# Patient Record
Sex: Female | Born: 1955 | Race: Black or African American | Hispanic: No | Marital: Married | Smoking: Current every day smoker
Health system: Southern US, Community
[De-identification: ages and names within clinical notes are randomized; demographics above are authoritative.]

## PROBLEM LIST (undated history)

## (undated) DIAGNOSIS — I10 Essential (primary) hypertension: Secondary | ICD-10-CM

## (undated) DIAGNOSIS — E785 Hyperlipidemia, unspecified: Secondary | ICD-10-CM

## (undated) DIAGNOSIS — J45909 Unspecified asthma, uncomplicated: Secondary | ICD-10-CM

## (undated) HISTORY — PX: ABDOMINAL HYSTERECTOMY: SHX81

## (undated) HISTORY — PX: UTERINE FIBROID SURGERY: SHX826

---

## 2004-01-13 ENCOUNTER — Inpatient Hospital Stay (HOSPITAL_COMMUNITY): Admission: RE | Admit: 2004-01-13 | Discharge: 2004-01-15 | Payer: Self-pay | Admitting: Obstetrics and Gynecology

## 2011-01-09 ENCOUNTER — Encounter: Payer: Self-pay | Admitting: Sports Medicine

## 2012-12-19 ENCOUNTER — Encounter (HOSPITAL_COMMUNITY): Payer: Self-pay | Admitting: Emergency Medicine

## 2012-12-19 ENCOUNTER — Emergency Department (HOSPITAL_COMMUNITY)
Admission: EM | Admit: 2012-12-19 | Discharge: 2012-12-19 | Disposition: A | Discharge status: Home / Self Care | Payer: Managed Care, Other (non HMO) | Attending: Emergency Medicine | Admitting: Emergency Medicine

## 2012-12-19 DIAGNOSIS — M545 Low back pain, unspecified: Secondary | ICD-10-CM | POA: Insufficient documentation

## 2012-12-19 HISTORY — DX: Unspecified asthma, uncomplicated: J45.909

## 2012-12-19 HISTORY — DX: Essential (primary) hypertension: I10

## 2012-12-19 HISTORY — DX: Hyperlipidemia, unspecified: E78.5

## 2012-12-19 MED ORDER — HYDROCODONE-ACETAMINOPHEN 5-325 MG PO TABS: 1.0000 | ORAL_TABLET | Freq: Four times a day (QID) | ORAL | Status: DC | PRN

## 2012-12-19 MED ORDER — PREDNISONE 50 MG PO TABS: 50.0000 mg | ORAL_TABLET | Freq: Every day | ORAL | Status: DC

## 2012-12-19 NOTE — ED Provider Notes (Signed)
Medical screening examination/treatment/procedure(s) were performed by non-physician practitioner and as supervising physician I was immediately available for consultation/collaboration.  Nyheim Seufert, MD 12/19/12 2111 

## 2012-12-19 NOTE — ED Notes (Signed)
Increased low back x 4 days, hx of 1 year pain
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
## 2016-06-23 ENCOUNTER — Emergency Department (HOSPITAL_COMMUNITY): Payer: Managed Care, Other (non HMO)

## 2016-06-23 ENCOUNTER — Emergency Department (HOSPITAL_COMMUNITY)
Admission: EM | Admit: 2016-06-23 | Discharge: 2016-06-23 | Disposition: A | Discharge status: Home / Self Care | Payer: Managed Care, Other (non HMO) | Attending: Emergency Medicine | Admitting: Emergency Medicine

## 2016-06-23 ENCOUNTER — Encounter (HOSPITAL_COMMUNITY): Payer: Self-pay | Admitting: Emergency Medicine

## 2016-06-23 DIAGNOSIS — Y92410 Unspecified street and highway as the place of occurrence of the external cause: Secondary | ICD-10-CM | POA: Insufficient documentation

## 2016-06-23 DIAGNOSIS — F17210 Nicotine dependence, cigarettes, uncomplicated: Secondary | ICD-10-CM | POA: Insufficient documentation

## 2016-06-23 DIAGNOSIS — Y939 Activity, unspecified: Secondary | ICD-10-CM | POA: Diagnosis not present

## 2016-06-23 DIAGNOSIS — I10 Essential (primary) hypertension: Secondary | ICD-10-CM | POA: Diagnosis not present

## 2016-06-23 DIAGNOSIS — Y999 Unspecified external cause status: Secondary | ICD-10-CM | POA: Insufficient documentation

## 2016-06-23 DIAGNOSIS — M25512 Pain in left shoulder: Secondary | ICD-10-CM | POA: Diagnosis present

## 2016-06-23 DIAGNOSIS — J45909 Unspecified asthma, uncomplicated: Secondary | ICD-10-CM | POA: Insufficient documentation

## 2016-06-23 MED ORDER — ACETAMINOPHEN 325 MG PO TABS
650.0000 mg | ORAL_TABLET | Freq: Once | ORAL | Status: AC
  Administered 2016-06-23: 650 mg via ORAL
  Filled 2016-06-23: qty 2

## 2016-06-23 MED ORDER — METHOCARBAMOL 500 MG PO TABS
500.0000 mg | ORAL_TABLET | Freq: Once | ORAL | Status: AC
  Administered 2016-06-23: 500 mg via ORAL
  Filled 2016-06-23: qty 1

## 2016-06-23 MED ORDER — CYCLOBENZAPRINE HCL 5 MG PO TABS: 5.0000 mg | ORAL_TABLET | Freq: Three times a day (TID) | ORAL | Status: DC | PRN

## 2016-06-23 MED ORDER — METHOCARBAMOL 500 MG PO TABS: 500.0000 mg | ORAL_TABLET | Freq: Two times a day (BID) | ORAL | Status: DC

## 2016-06-23 MED ORDER — METHOCARBAMOL 500 MG PO TABS: 1000.0000 mg | ORAL_TABLET | Freq: Once | ORAL | Status: DC
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
## 2016-06-24 NOTE — ED Notes (Signed)
Pt. Came back to pick up her work note.

## 2016-11-18 DEATH — deceased

## 2018-03-07 ENCOUNTER — Encounter: Payer: Self-pay | Admitting: Endocrinology

## 2018-03-07 ENCOUNTER — Ambulatory Visit: Payer: Managed Care, Other (non HMO) | Admitting: Endocrinology

## 2018-03-07 ENCOUNTER — Telehealth: Payer: Self-pay | Admitting: Endocrinology

## 2018-03-07 DIAGNOSIS — E119 Type 2 diabetes mellitus without complications: Secondary | ICD-10-CM

## 2018-03-07 MED ORDER — ONETOUCH DELICA LANCETS 33G MISC: 5 refills | Status: DC

## 2018-03-07 MED ORDER — METFORMIN HCL ER 500 MG PO TB24
500.0000 mg | ORAL_TABLET | Freq: Every day | ORAL | 3 refills | Status: DC
Start: 2018-03-07 — End: 2018-06-20

## 2018-03-07 MED ORDER — GLUCOSE BLOOD VI STRP: 1.0000 | ORAL_STRIP | Freq: Every day | 12 refills | Status: DC

## 2018-03-07 NOTE — Telephone Encounter (Signed)
Rx submitted for lancets.

## 2018-03-07 NOTE — Telephone Encounter (Signed)
[facility]-script says Lancets as well but does not specify what Lancets. Please call them at ph# [phone] to clarify

## 2018-03-07 NOTE — Progress Notes (Signed)
Subjective:    Patient ID: Kaylee Campbell, female    DOB: January 16, 1956, 62 y.o.   MRN: [id number]  HPI pt is referred by Dr Billy Coastaavon, for diabetes.  DM was dx'ed in early 2019; she has mild if any neuropathy of the lower extremities; she is unaware of any associated chronic complications; she has never been on DM medication; pt says her diet and exercise are good; she has never had GDM, pancreatitis, pancreatic surgery, severe hypoglycemia or DKA.   Past Medical History:  Diagnosis Date  . Asthma   . Hyperlipemia   . Hypertension     Past Surgical History:  Procedure Laterality Date  . ABDOMINAL HYSTERECTOMY    . CESAREAN SECTION    . UTERINE FIBROID SURGERY      Social History   Socioeconomic History  . Marital status: Married    Spouse name: Not on file  . Number of children: Not on file  . Years of education: Not on file  . Highest education level: Not on file  Occupational History  . Not on file  Social Needs  . Financial resource strain: Not on file  . Food insecurity:    Worry: Not on file    Inability: Not on file  . Transportation needs:    Medical: Not on file    Non-medical: Not on file  Tobacco Use  . Smoking status: Current Every Day Smoker    Types: Cigarettes  . Smokeless tobacco: Never Used  Substance and Sexual Activity  . Alcohol use: Yes  . Drug use: Not on file  . Sexual activity: Not on file  Lifestyle  . Physical activity:    Days per week: Not on file    Minutes per session: Not on file  . Stress: Not on file  Relationships  . Social connections:    Talks on phone: Not on file    Gets together: Not on file    Attends religious service: Not on file    Active member of club or organization: Not on file    Attends meetings of clubs or organizations: Not on file    Relationship status: Not on file  . Intimate partner violence:    Fear of current or ex partner: Not on file    Emotionally abused: Not on file    Physically abused: Not on file     Forced sexual activity: Not on file  Other Topics Concern  . Not on file  Social History Narrative  . Not on file    Current Outpatient Medications on File Prior to Visit  Medication Sig Dispense Refill  . Multiple Vitamins-Minerals (ONE-A-DAY WOMENS 50+ ADVANTAGE PO) Take 1 tablet by mouth every morning.    Marland Kitchen. omeprazole (PRILOSEC) 40 MG capsule Take 40 mg by mouth daily.    Marland Kitchen. triamterene-hydrochlorothiazide (MAXZIDE-25) 37.5-25 MG per tablet Take 1 tablet by mouth daily.     No current facility-administered medications on file prior to visit.     No Known Allergies  Family History  Problem Relation Age of Onset  . Diabetes Father   . Hypertension Father   . Cancer Mother   . Heart failure Mother   . Hypertension Mother     Ht 5\' 2"  (1.575 m)    Review of Systems denies blurry vision, headache, chest pain, sob, n/v, urinary frequency, muscle cramps, excessive diaphoresis, memory loss, depression, cold intolerance, rhinorrhea, and easy bruising.  She has lost a few lbs.  Objective:   Physical Exam GEN: no distress HEAD: head: no deformity eyes: no periorbital swelling, no proptosis external nose and ears are normal.   mouth: no lesion seen NECK: supple, thyroid is not enlarged.  CHEST WALL: no deformity LUNGS: clear to auscultation CV: reg rate and rhythm, no murmur ABD: abdomen is soft, nontender.  no hepatosplenomegaly.  not distended.  no hernia MUSCULOSKELETAL: muscle bulk and strength are grossly normal.  no obvious joint swelling.  gait is normal and steady EXTEMITIES: no deformity.  no ulcer on the feet.  feet are of normal color and temp.  no edema PULSES: dorsalis pedis intact bilat.  no carotid bruit NEURO:  cn 2-12 grossly intact.   readily moves all 4's.  sensation is intact to touch on the feet SKIN:  Normal texture and temperature.  No rash or suspicious lesion is visible.   NODES:  None palpable at the neck PSYCH: alert, well-oriented.  Does  not appear anxious nor depressed.   outside test results are reviewed: A1c=6.5%  I have reviewed outside records, and summarized: Pt was noted to have elevated a1c, and referred here.  Other health probs (HTN, GERD), were stable     Assessment & Plan:  Type 2 DM< new  Patient Instructions  I have sent a prescription to your pharmacy, for the blood sugar good diet and exercise significantly improve the control of your diabetes.  please let me know if you wish to be referred to a dietician.  high blood sugar is very risky to your health.  you should see an eye doctor and dentist every year.  It is very important to get all recommended vaccinations.  Controlling your blood pressure and cholesterol drastically reduces the damage diabetes does to your body.  Those who smoke should quit.  Please discuss these with your doctor.  check your blood sugar once a day.  vary the time of day when you check, between before the 3 meals, and at bedtime.  also check if you have symptoms of your blood sugar being too high or too low.  please keep a record of the readings and bring it to your next appointment here (or you can bring the meter itself).  You can write it on any piece of paper.  please call us sooner if your blood sugar goes below 70, or if you have a lot of readings over 200.   At our office, we are fortunate to have two specialists who are happy to help you:   Cristy Folks, RN, CDE, is a diabetes educator and pump trainer.  She is here on Monday mornings, and all day Tuesday and Wednesday.  She is can help you with low blood sugar avoidance and treatment, injecting insulin, sick day management, and others.   Oran Rein, RD is our dietician.  She is here all day Thursday and Friday.  She can advise you about a healthy diet.  She can also help you about a variety of special diabetes situations, such as shift work, Animal nutritionist, gluten-free, diet for kidney patients, traveling with diabetes, and  help for those who need to gain weight.   Please come back for a follow-up appointment in 2 months.

## 2018-03-07 NOTE — Patient Instructions (Signed)
I have sent a prescription to your pharmacy, for the blood sugar good diet and exercise significantly improve the control of your diabetes.  please let me know if you wish to be referred to a dietician.  high blood sugar is very risky to your health.  you should see an eye doctor and dentist every year.  It is very important to get all recommended vaccinations.  Controlling your blood pressure and cholesterol drastically reduces the damage diabetes does to your body.  Those who smoke should quit.  Please discuss these with your doctor.  check your blood sugar once a day.  vary the time of day when you check, between before the 3 meals, and at bedtime.  also check if you have symptoms of your blood sugar being too high or too low.  please keep a record of the readings and bring it to your next appointment here (or you can bring the meter itself).  You can write it on any piece of paper.  please call us sooner if your blood sugar goes below 70, or if you have a lot of readings over 200.   At our office, we are fortunate to have two specialists who are happy to help you:   Cristy FolksLinda Spagnola, RN, CDE, is a diabetes educator and pump trainer.  She is here on Monday mornings, and all day Tuesday and Wednesday.  She is can help you with low blood sugar avoidance and treatment, injecting insulin, sick day management, and others.   Oran ReinLaura Jobe, RD is our dietician.  She is here all day Thursday and Friday.  She can advise you about a healthy diet.  She can also help you about a variety of special diabetes situations, such as shift work, Animal nutritionistvegeterian diet, gluten-free, diet for kidney patients, traveling with diabetes, and help for those who need to gain weight.   Please come back for a follow-up appointment in 2 months.

## 2018-03-08 DIAGNOSIS — E119 Type 2 diabetes mellitus without complications: Secondary | ICD-10-CM | POA: Insufficient documentation

## 2018-06-20 ENCOUNTER — Encounter: Payer: Self-pay | Admitting: Endocrinology

## 2018-06-20 ENCOUNTER — Ambulatory Visit: Payer: Managed Care, Other (non HMO) | Admitting: Endocrinology

## 2018-06-20 VITALS — BP 132/80 | HR 79 | Wt 149.4 lb

## 2018-06-20 DIAGNOSIS — E119 Type 2 diabetes mellitus without complications: Secondary | ICD-10-CM | POA: Diagnosis not present

## 2018-06-20 LAB — POCT GLYCOSYLATED HEMOGLOBIN (HGB A1C): Hemoglobin A1C: 6.1 % — AB (ref 4.0–5.6)

## 2018-06-20 NOTE — Progress Notes (Signed)
Subjective:    Patient ID: Kaylee Campbell, female    DOB: 14-Aug-1956, 62 y.o.   MRN: [id number]  HPI Pt returns for f/u of diabetes mellitus: DM type: 2 Dx'ed: 2019 Complications: none Therapy: none now GDM: never DKA: never Severe hypoglycemia: never Pancreatitis: never Pancreatic imaging: never Other: she has never been on insulin Interval history: she stopped metformin.  pt states she feels well in general. Past Medical History:  Diagnosis Date  . Asthma   . Hyperlipemia   . Hypertension     Past Surgical History:  Procedure Laterality Date  . ABDOMINAL HYSTERECTOMY    . CESAREAN SECTION    . UTERINE FIBROID SURGERY      Social History   Socioeconomic History  . Marital status: Married    Spouse name: Not on file  . Number of children: Not on file  . Years of education: Not on file  . Highest education level: Not on file  Occupational History  . Not on file  Social Needs  . Financial resource strain: Not on file  . Food insecurity:    Worry: Not on file    Inability: Not on file  . Transportation needs:    Medical: Not on file    Non-medical: Not on file  Tobacco Use  . Smoking status: Current Every Day Smoker    Types: Cigarettes  . Smokeless tobacco: Never Used  Substance and Sexual Activity  . Alcohol use: Yes  . Drug use: Not on file  . Sexual activity: Not on file  Lifestyle  . Physical activity:    Days per week: Not on file    Minutes per session: Not on file  . Stress: Not on file  Relationships  . Social connections:    Talks on phone: Not on file    Gets together: Not on file    Attends religious service: Not on file    Active member of club or organization: Not on file    Attends meetings of clubs or organizations: Not on file    Relationship status: Not on file  . Intimate partner violence:    Fear of current or ex partner: Not on file    Emotionally abused: Not on file    Physically abused: Not on file    Forced sexual  activity: Not on file  Other Topics Concern  . Not on file  Social History Narrative  . Not on file    Current Outpatient Medications on File Prior to Visit  Medication Sig Dispense Refill  . glucose blood (ONETOUCH VERIO) test strip 1 each by Other route daily. And lancets 1/day 100 each 12  . Multiple Vitamins-Minerals (ONE-A-DAY WOMENS 50+ ADVANTAGE PO) Take 1 tablet by mouth every morning.    Marland Kitchen omeprazole (PRILOSEC) 40 MG capsule Take 40 mg by mouth daily.    Letta Pate DELICA LANCETS 33G MISC Use to check blood sugar 1 time per day. 100 each 5  . triamterene-hydrochlorothiazide (MAXZIDE-25) 37.5-25 MG per tablet Take 1 tablet by mouth daily.     No current facility-administered medications on file prior to visit.     No Known Allergies  Family History  Problem Relation Age of Onset  . Diabetes Father   . Hypertension Father   . Cancer Mother   . Heart failure Mother   . Hypertension Mother     BP 132/80 (BP Location: Left Arm, Patient Position: Sitting, Cuff Size: Normal)   Pulse 79  Wt 149 lb 6.4 oz (67.8 kg)   SpO2 98%   BMI 27.33 kg/m    Review of Systems She has lost weight, due to her efforts    Objective:   Physical Exam VITAL SIGNS:  See vs page GENERAL: no distress Pulses: dorsalis pedis intact bilat.   MSK: no deformity of the feet CV: no leg edema Skin:  no ulcer on the feet.  normal color and temp on the feet. Neuro: sensation is intact to touch on the feet    Lab Results  Component Value Date   HGBA1C 6.1 (A) 06/20/2018      Assessment & Plan:  Type 2 DM: well-controlled Weight loss: pt is advised to continue her efforts  Patient Instructions  check your blood sugar once a day.  vary the time of day when you check, between before the 3 meals, and at bedtime.  also check if you have symptoms of your blood sugar being too high or too low.  please keep a record of the readings and bring it to your next appointment here (or you can bring the  meter itself).  You can write it on any piece of paper.  please call us sooner if your blood sugar goes below 70, or if you have a lot of readings over 200.   Please continue your dietary efforts. Please come back for a follow-up appointment in 3-4 months.

## 2018-06-20 NOTE — Patient Instructions (Addendum)
check your blood sugar once a day.  vary the time of day when you check, between before the 3 meals, and at bedtime.  also check if you have symptoms of your blood sugar being too high or too low.  please keep a record of the readings and bring it to your next appointment here (or you can bring the meter itself).  You can write it on any piece of paper.  please call us sooner if your blood sugar goes below 70, or if you have a lot of readings over 200.   Please continue your dietary efforts. Please come back for a follow-up appointment in 3-4 months.

## 2020-02-10 ENCOUNTER — Telehealth: Payer: Self-pay | Admitting: Endocrinology

## 2020-02-10 NOTE — Telephone Encounter (Signed)
LVM requesting returned call 

## 2020-02-10 NOTE — Telephone Encounter (Signed)
please contact patient: F/u is due 

## 2022-03-19 ENCOUNTER — Ambulatory Visit (INDEPENDENT_AMBULATORY_CARE_PROVIDER_SITE_OTHER): Payer: Medicare HMO

## 2022-03-19 ENCOUNTER — Encounter (HOSPITAL_COMMUNITY): Payer: Self-pay | Admitting: Emergency Medicine

## 2022-03-19 ENCOUNTER — Ambulatory Visit (HOSPITAL_COMMUNITY)
Admission: EM | Admit: 2022-03-19 | Discharge: 2022-03-19 | Disposition: A | Discharge status: Home / Self Care | Payer: Medicare HMO | Attending: Nurse Practitioner | Admitting: Nurse Practitioner

## 2022-03-19 DIAGNOSIS — M25561 Pain in right knee: Secondary | ICD-10-CM

## 2022-03-19 MED ORDER — VALACYCLOVIR HCL 1 G PO TABS: 1000.0000 mg | ORAL_TABLET | Freq: Three times a day (TID) | ORAL | 0 refills | Status: AC

## 2022-03-19 MED ORDER — DICLOFENAC SODIUM 1 % EX GEL: 4.0000 g | Freq: Four times a day (QID) | CUTANEOUS | 0 refills | Status: AC

## 2022-03-19 MED ORDER — PREDNISONE 20 MG PO TABS: 20.0000 mg | ORAL_TABLET | Freq: Every day | ORAL | 0 refills | Status: AC

## 2022-03-19 NOTE — ED Triage Notes (Signed)
Pt reports had issues with right knee for while and had to get cortisone shots in it before. Repots pain and swelling for 3 days. Reports redness above right knee for few days. Unsure if bit by something.  ?

## 2022-03-19 NOTE — Discharge Instructions (Addendum)
Your x-rays are negative for any dislocation or fracture.  They do show degenerative changes consistent with osteoarthritis. ?Take medication as prescribed. ?May apply cool compresses to the right leg/knee to help with pain or discomfort. ?Weightbearing as tolerated until symptoms improve. ?Follow-up with your regular doctor if symptoms worsen or do not improve. ? ?

## 2022-03-19 NOTE — ED Provider Notes (Addendum)
?[facility] ? ? ? ?CSN: [id number] ?Arrival date & time: 03/19/22  1626 ? ? ?  ? ?History   ?Chief Complaint ?Chief Complaint  ?Patient presents with  ? Leg Pain  ? ? ?HPI ?Kaylee Campbell is a 66 y.o. female.  ? ?The patient is a 66 year old female who presents for symptoms of right leg pain.  Symptoms started approximately 3 days ago.  She also complains of a red area above her leg that started around the same time.  She has had intermittent swelling.  She reports pain with ambulation, and most movement.  She states that the pain is located in the middle of the knee.  States that in the past she has had cortisone injections in the right knee.  She denies any injury or trauma.  States she has been taking ibuprofen, Tylenol for her symptoms with little to no improvement.  Pain does not radiate to the upper or lower part of her right leg. ? ?The patient also complains of a rash in the upper part of her right thigh.  She states the pain is "burning" and tender to touch.  She states that her symptoms also started when her knee pain started.  She states the area has not changed much and is characteristic.  She states the area has not spread.  She denies having a shingles vaccine.  She has not taken any medication for her symptoms. ? ?The history is provided by the patient.  ? ?Past Medical History:  ?Diagnosis Date  ? Asthma   ? Hyperlipemia   ? Hypertension   ? ? ?Patient Active Problem List  ? Diagnosis Date Noted  ? Diabetes (HCC) 03/08/2018  ? ? ?Past Surgical History:  ?Procedure Laterality Date  ? ABDOMINAL HYSTERECTOMY    ? CESAREAN SECTION    ? UTERINE FIBROID SURGERY    ? ? ?OB History   ?No obstetric history on file. ?  ? ? ? ?Home Medications   ? ?Prior to Admission medications   ?Medication Sig Start Date End Date Taking? Authorizing Provider  ?diclofenac Sodium (VOLTAREN) 1 % GEL Apply 4 g topically 4 (four) times daily. 03/19/22 04/18/22 Yes Rilie Glanz-Warren, Sadie Haberhristie J, NP  ?predniSONE (DELTASONE) 20 MG  tablet Take 1 tablet (20 mg total) by mouth daily with breakfast for 5 days. 03/19/22 03/24/22 Yes Durand Wittmeyer-Warren, Sadie Haberhristie J, NP  ?valACYclovir (VALTREX) 1000 MG tablet Take 1 tablet (1,000 mg total) by mouth 3 (three) times daily for 7 days. 03/19/22 03/26/22 Yes Taber Sweetser-Warren, Sadie Haberhristie J, NP  ?glucose blood (ONETOUCH VERIO) test strip 1 each by Other route daily. And lancets 1/day 03/07/18   Romero BellingEllison, Sean, MD  ?Multiple Vitamins-Minerals (ONE-A-DAY WOMENS 50+ ADVANTAGE PO) Take 1 tablet by mouth every morning.    [provider]  ?omeprazole (PRILOSEC) 40 MG capsule Take 40 mg by mouth daily.    [provider]  ?Dola ArgyleNETOUCH DELICA LANCETS 33G MISC Use to check blood sugar 1 time per day. 03/07/18   Romero BellingEllison, Sean, MD  ?triamterene-hydrochlorothiazide (MAXZIDE-25) 37.5-25 MG per tablet Take 1 tablet by mouth daily.    [provider]  ? ? ?Family History ?Family History  ?Problem Relation Age of Onset  ? Diabetes Father   ? Hypertension Father   ? Cancer Mother   ? Heart failure Mother   ? Hypertension Mother   ? ? ?Social History ?Social History  ? ?Tobacco Use  ? Smoking status: Every Day  ?  Types: Cigarettes  ? Smokeless  tobacco: Never  ?Substance Use Topics  ? Alcohol use: Yes  ? ? ? ?Allergies   ?Patient has no known allergies. ? ? ?Review of Systems ?Review of Systems  ?Constitutional: Negative.   ?Respiratory: Negative.    ?Cardiovascular: Negative.   ?Musculoskeletal:  Positive for arthralgias and joint swelling.  ?     Right knee pain and swelling x 3 days  ?Skin: Negative.  Color change: right thigh.  ?Psychiatric/Behavioral: Negative.    ? ? ?Physical Exam ?Triage Vital Signs ?ED Triage Vitals  ?Enc Vitals Group  ?   BP 03/19/22 1800 (!) 161/79  ?   Pulse Rate 03/19/22 1800 81  ?   Resp 03/19/22 1800 19  ?   Temp 03/19/22 1800 98.3 ?F (36.8 ?C)  ?   Temp Source 03/19/22 1800 Oral  ?   SpO2 03/19/22 1800 96 %  ?   Weight --   ?   Height --   ?   Head Circumference --   ?   Peak Flow --   ?    Pain Score 03/19/22 1758 9  ?   Pain Loc --   ?   Pain Edu? --   ?   Excl. in GC? --   ? ?No data found. ? ?Updated Vital Signs ?BP (!) 161/79 (BP Location: Left Arm)   Pulse 81   Temp 98.3 ?F (36.8 ?C) (Oral)   Resp 19   SpO2 96%  ? ?Visual Acuity ?Right Eye Distance:   ?Left Eye Distance:   ?Bilateral Distance:   ? ?Right Eye Near:   ?Left Eye Near:    ?Bilateral Near:    ? ?Physical Exam ?Vitals reviewed.  ?Constitutional:   ?   General: She is not in acute distress. ?   Appearance: Normal appearance.  ?Cardiovascular:  ?   Rate and Rhythm: Normal rate and regular rhythm.  ?   Pulses: Normal pulses.  ?   Heart sounds: Normal heart sounds.  ?Pulmonary:  ?   Effort: Pulmonary effort is normal.  ?   Breath sounds: Normal breath sounds.  ?Musculoskeletal:  ?   Right knee: Swelling present. No deformity or erythema. Decreased range of motion. Tenderness present over the patellar tendon.  ?   Left knee: Normal.  ?Skin: ?   General: Skin is warm.  ?   Findings: Erythema present.  ? ?    ?   Comments: Area of erythema and tenderness to the right thigh. Area is tender to palpation.   ?Neurological:  ?   Mental Status: She is alert.  ? ? ? ?UC Treatments / Results  ?Labs ?(all labs ordered are listed, but only abnormal results are displayed) ?Labs Reviewed - No data to display ? ?EKG ? ? ?Radiology ?DG Knee Complete 4 Views Right ? ?Result Date: 03/19/2022 ?CLINICAL DATA:  Right knee pain, swelling EXAM: RIGHT KNEE - COMPLETE 4+ VIEW COMPARISON:  None. FINDINGS: Mild tricompartment degenerative changes with joint space narrowing and spurring. No joint effusion. No acute bony abnormality. Specifically, no fracture, subluxation, or dislocation. IMPRESSION: Mild degenerative changes.  No acute bony abnormality. Electronically Signed   By: Charlett Nose M.D.   On: 03/19/2022 19:08   ? ?Procedures ?Procedures (including critical care time) ? ?Medications Ordered in UC ?Medications - No data to display ? ?Initial Impression /  Assessment and Plan / UC Course  ?I have reviewed the triage vital signs and the nursing notes. ? ?Pertinent labs & imaging results  that were available during my care of the patient were reviewed by me and considered in my medical decision making (see chart for details). ? ?The patient is a 66 year old female who presents for complaints of right knee pain.  Symptoms have been present for the past 3 days.  She is also noticed an area of redness with tenderness and burning on the right thigh superior to the knee.  Pain is located in the patella of the right knee.  The area of erythema and redness on the right thigh is tender to palpation.  Symptoms started at the same time without injury or trauma.Cathlean Sauer of the knee was performed which showed no fracture or dislocation, degenerative changes which is consistent with the patient's report of osteoarthritis.  Patient's symptoms are consistent with shingles rash and the pain in the right knee is most likely referred pain from that area.  She can also be experiencing arthritic pain.  Given the information provided by the patient, her x-rays, and physical exam, will start the patient on valacyclovir and prednisone for her symptoms.  Discussion with patient regarding the possible cause of her knee pain.  She is in agreement with starting this regimen.  We will also prescribe the patient Voltaren gel for her knee.  Patient advised to follow-up if her symptoms do not improve. ?Final Clinical Impressions(s) / UC Diagnoses  ? ?Final diagnoses:  ?Right knee pain, unspecified chronicity  ? ? ? ?Discharge Instructions   ? ?  ?Your x-rays are negative for any dislocation or fracture.  They do show degenerative changes consistent with osteoarthritis. ?Take medication as prescribed. ?May apply cool compresses to the right leg/knee to help with pain or discomfort. ?Weightbearing as tolerated until symptoms improve. ?Follow-up with your regular doctor if symptoms worsen or do not  improve. ? ? ? ? ? ?ED Prescriptions   ? ? Medication Sig Dispense Auth. Provider  ? predniSONE (DELTASONE) 20 MG tablet Take 1 tablet (20 mg total) by mouth daily with breakfast for 5 days. 5 tablet Nataliah Hatlestad-Warre

## 2022-06-01 DIAGNOSIS — I10 Essential (primary) hypertension: Secondary | ICD-10-CM | POA: Diagnosis not present

## 2022-06-01 DIAGNOSIS — I8311 Varicose veins of right lower extremity with inflammation: Secondary | ICD-10-CM | POA: Diagnosis not present

## 2022-06-01 DIAGNOSIS — L819 Disorder of pigmentation, unspecified: Secondary | ICD-10-CM | POA: Diagnosis not present

## 2022-06-01 DIAGNOSIS — R252 Cramp and spasm: Secondary | ICD-10-CM | POA: Diagnosis not present

## 2022-06-17 DIAGNOSIS — I10 Essential (primary) hypertension: Secondary | ICD-10-CM | POA: Diagnosis not present

## 2022-06-29 DIAGNOSIS — M7989 Other specified soft tissue disorders: Secondary | ICD-10-CM | POA: Diagnosis not present

## 2022-06-29 DIAGNOSIS — I83811 Varicose veins of right lower extremities with pain: Secondary | ICD-10-CM | POA: Diagnosis not present

## 2022-06-29 DIAGNOSIS — R252 Cramp and spasm: Secondary | ICD-10-CM | POA: Diagnosis not present

## 2022-06-29 DIAGNOSIS — I8311 Varicose veins of right lower extremity with inflammation: Secondary | ICD-10-CM | POA: Diagnosis not present

## 2022-06-29 DIAGNOSIS — L819 Disorder of pigmentation, unspecified: Secondary | ICD-10-CM | POA: Diagnosis not present

## 2022-09-21 DIAGNOSIS — H524 Presbyopia: Secondary | ICD-10-CM | POA: Diagnosis not present

## 2022-09-21 DIAGNOSIS — H5203 Hypermetropia, bilateral: Secondary | ICD-10-CM | POA: Diagnosis not present

## 2022-09-21 DIAGNOSIS — H259 Unspecified age-related cataract: Secondary | ICD-10-CM | POA: Diagnosis not present

## 2022-09-21 DIAGNOSIS — E119 Type 2 diabetes mellitus without complications: Secondary | ICD-10-CM | POA: Diagnosis not present

## 2022-09-21 DIAGNOSIS — Z0100 Encounter for examination of eyes and vision without abnormal findings: Secondary | ICD-10-CM | POA: Diagnosis not present

## 2022-10-14 DIAGNOSIS — M1711 Unilateral primary osteoarthritis, right knee: Secondary | ICD-10-CM | POA: Diagnosis not present

## 2022-10-14 DIAGNOSIS — M25561 Pain in right knee: Secondary | ICD-10-CM | POA: Diagnosis not present

## 2023-02-10 DIAGNOSIS — M1711 Unilateral primary osteoarthritis, right knee: Secondary | ICD-10-CM | POA: Diagnosis not present

## 2023-03-16 DIAGNOSIS — R7309 Other abnormal glucose: Secondary | ICD-10-CM | POA: Diagnosis not present

## 2023-03-16 DIAGNOSIS — E7800 Pure hypercholesterolemia, unspecified: Secondary | ICD-10-CM | POA: Diagnosis not present

## 2023-03-16 DIAGNOSIS — Z124 Encounter for screening for malignant neoplasm of cervix: Secondary | ICD-10-CM | POA: Diagnosis not present

## 2023-03-16 DIAGNOSIS — Z01419 Encounter for gynecological examination (general) (routine) without abnormal findings: Secondary | ICD-10-CM | POA: Diagnosis not present

## 2023-03-16 DIAGNOSIS — Z01411 Encounter for gynecological examination (general) (routine) with abnormal findings: Secondary | ICD-10-CM | POA: Diagnosis not present

## 2023-03-16 DIAGNOSIS — E876 Hypokalemia: Secondary | ICD-10-CM | POA: Diagnosis not present

## 2023-03-16 DIAGNOSIS — Z1231 Encounter for screening mammogram for malignant neoplasm of breast: Secondary | ICD-10-CM | POA: Diagnosis not present

## 2023-03-16 DIAGNOSIS — Z90711 Acquired absence of uterus with remaining cervical stump: Secondary | ICD-10-CM | POA: Diagnosis not present

## 2023-03-27 DIAGNOSIS — Z131 Encounter for screening for diabetes mellitus: Secondary | ICD-10-CM | POA: Diagnosis not present

## 2023-03-27 DIAGNOSIS — Z1329 Encounter for screening for other suspected endocrine disorder: Secondary | ICD-10-CM | POA: Diagnosis not present

## 2023-03-27 DIAGNOSIS — Z0000 Encounter for general adult medical examination without abnormal findings: Secondary | ICD-10-CM | POA: Diagnosis not present

## 2023-03-27 DIAGNOSIS — Z13220 Encounter for screening for lipoid disorders: Secondary | ICD-10-CM | POA: Diagnosis not present

## 2023-04-03 DIAGNOSIS — H524 Presbyopia: Secondary | ICD-10-CM | POA: Diagnosis not present

## 2023-04-03 DIAGNOSIS — H52223 Regular astigmatism, bilateral: Secondary | ICD-10-CM | POA: Diagnosis not present

## 2023-04-13 DIAGNOSIS — E785 Hyperlipidemia, unspecified: Secondary | ICD-10-CM | POA: Diagnosis not present

## 2023-04-13 DIAGNOSIS — I10 Essential (primary) hypertension: Secondary | ICD-10-CM | POA: Diagnosis not present

## 2023-04-13 DIAGNOSIS — E119 Type 2 diabetes mellitus without complications: Secondary | ICD-10-CM | POA: Diagnosis not present

## 2023-05-03 DIAGNOSIS — R7309 Other abnormal glucose: Secondary | ICD-10-CM | POA: Diagnosis not present

## 2023-07-07 DIAGNOSIS — M25561 Pain in right knee: Secondary | ICD-10-CM | POA: Diagnosis not present

## 2023-07-07 DIAGNOSIS — M1711 Unilateral primary osteoarthritis, right knee: Secondary | ICD-10-CM | POA: Diagnosis not present

## 2023-09-01 DIAGNOSIS — H5203 Hypermetropia, bilateral: Secondary | ICD-10-CM | POA: Diagnosis not present

## 2023-09-19 ENCOUNTER — Encounter (INDEPENDENT_AMBULATORY_CARE_PROVIDER_SITE_OTHER): Payer: Medicare HMO | Admitting: Ophthalmology

## 2023-09-19 DIAGNOSIS — H35033 Hypertensive retinopathy, bilateral: Secondary | ICD-10-CM | POA: Diagnosis not present

## 2023-09-19 DIAGNOSIS — H43813 Vitreous degeneration, bilateral: Secondary | ICD-10-CM

## 2023-09-19 DIAGNOSIS — E113291 Type 2 diabetes mellitus with mild nonproliferative diabetic retinopathy without macular edema, right eye: Secondary | ICD-10-CM | POA: Diagnosis not present

## 2023-09-19 DIAGNOSIS — H2513 Age-related nuclear cataract, bilateral: Secondary | ICD-10-CM | POA: Diagnosis not present

## 2023-09-19 DIAGNOSIS — I10 Essential (primary) hypertension: Secondary | ICD-10-CM | POA: Diagnosis not present

## 2023-09-19 DIAGNOSIS — Z7984 Long term (current) use of oral hypoglycemic drugs: Secondary | ICD-10-CM

## 2023-10-13 DIAGNOSIS — M1711 Unilateral primary osteoarthritis, right knee: Secondary | ICD-10-CM | POA: Diagnosis not present

## 2023-12-05 DIAGNOSIS — R490 Dysphonia: Secondary | ICD-10-CM | POA: Diagnosis not present

## 2023-12-19 DIAGNOSIS — M79604 Pain in right leg: Secondary | ICD-10-CM | POA: Diagnosis not present

## 2023-12-19 DIAGNOSIS — M79605 Pain in left leg: Secondary | ICD-10-CM | POA: Diagnosis not present

## 2023-12-19 DIAGNOSIS — R600 Localized edema: Secondary | ICD-10-CM | POA: Diagnosis not present

## 2023-12-19 DIAGNOSIS — I872 Venous insufficiency (chronic) (peripheral): Secondary | ICD-10-CM | POA: Diagnosis not present

## 2023-12-19 DIAGNOSIS — I83893 Varicose veins of bilateral lower extremities with other complications: Secondary | ICD-10-CM | POA: Diagnosis not present

## 2024-02-06 IMAGING — DX DG KNEE COMPLETE 4+V*R*
4 series · 4 of 4 positions shown · non-contrast
Comparison: None.

CLINICAL DATA: Right knee pain, swelling

EXAM:
RIGHT KNEE - COMPLETE 4+ VIEW

[knee ap]
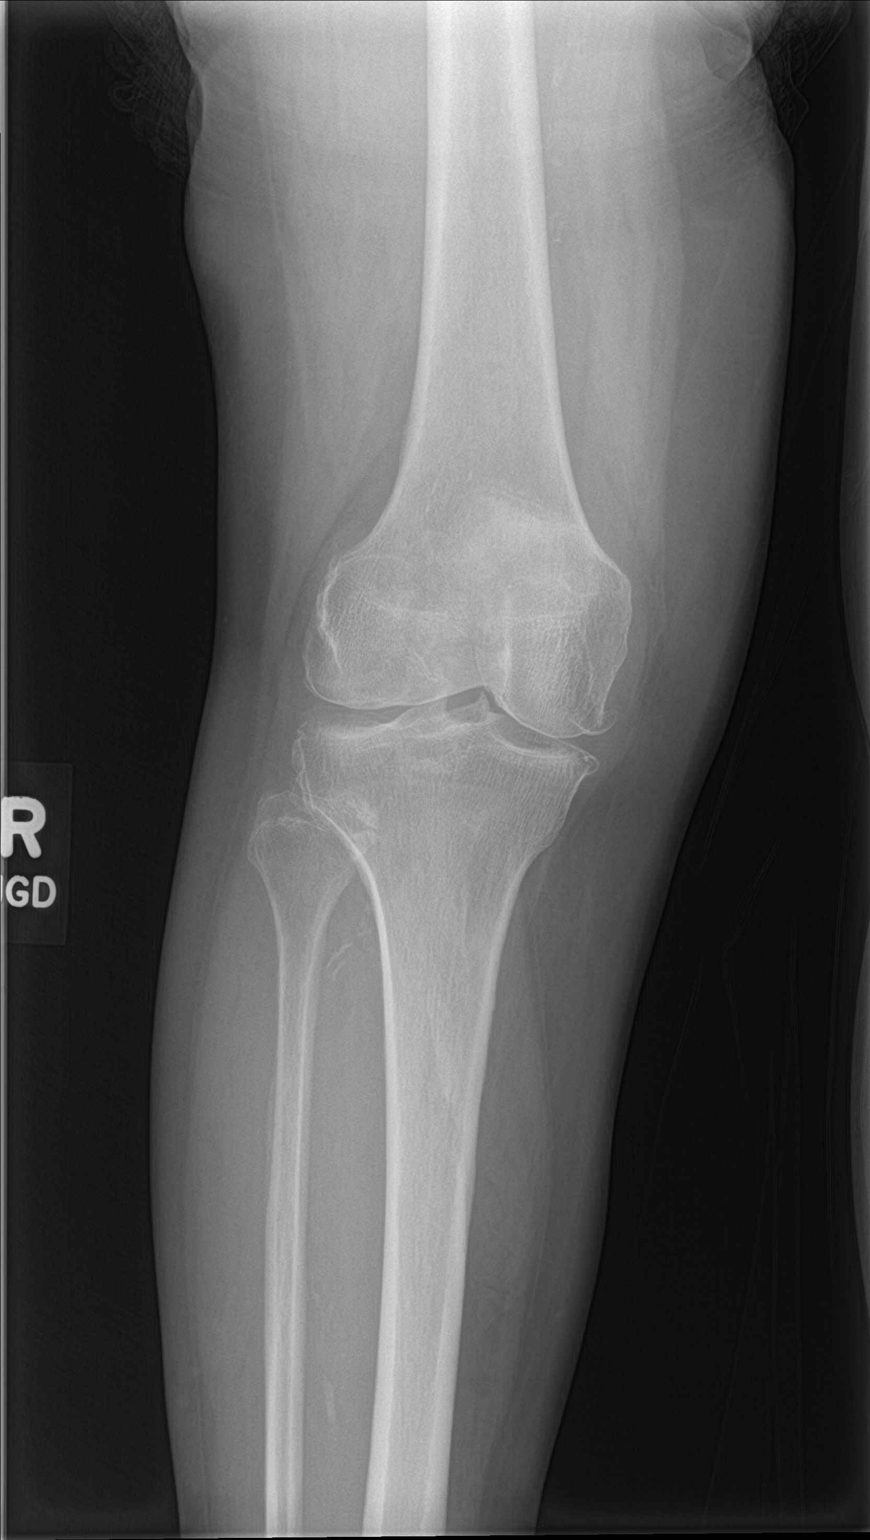

[knee obl (1 of 2)]
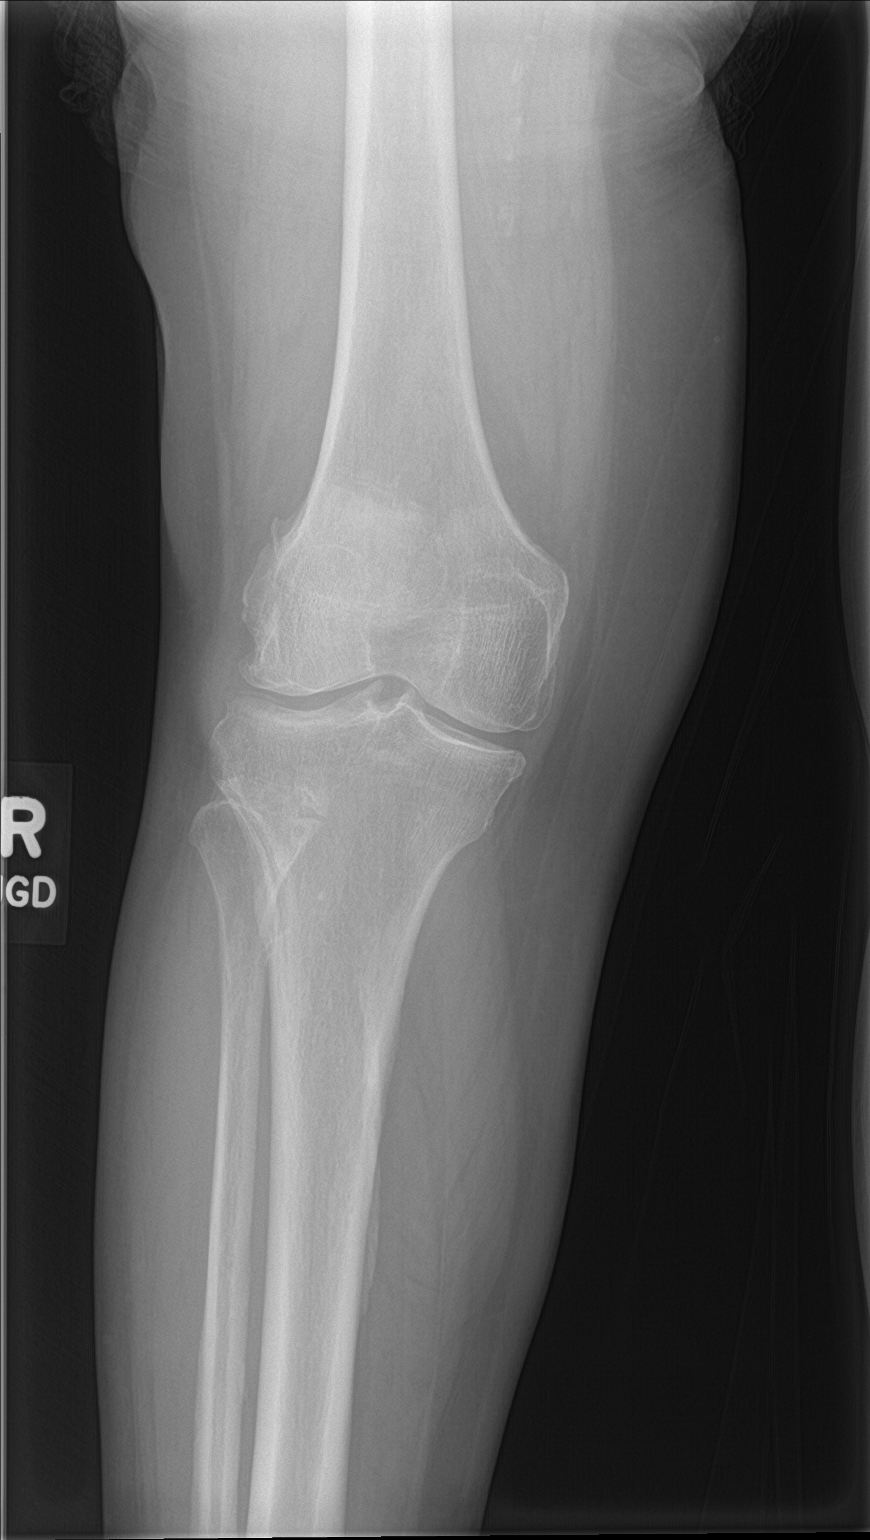

[knee obl (2 of 2)]
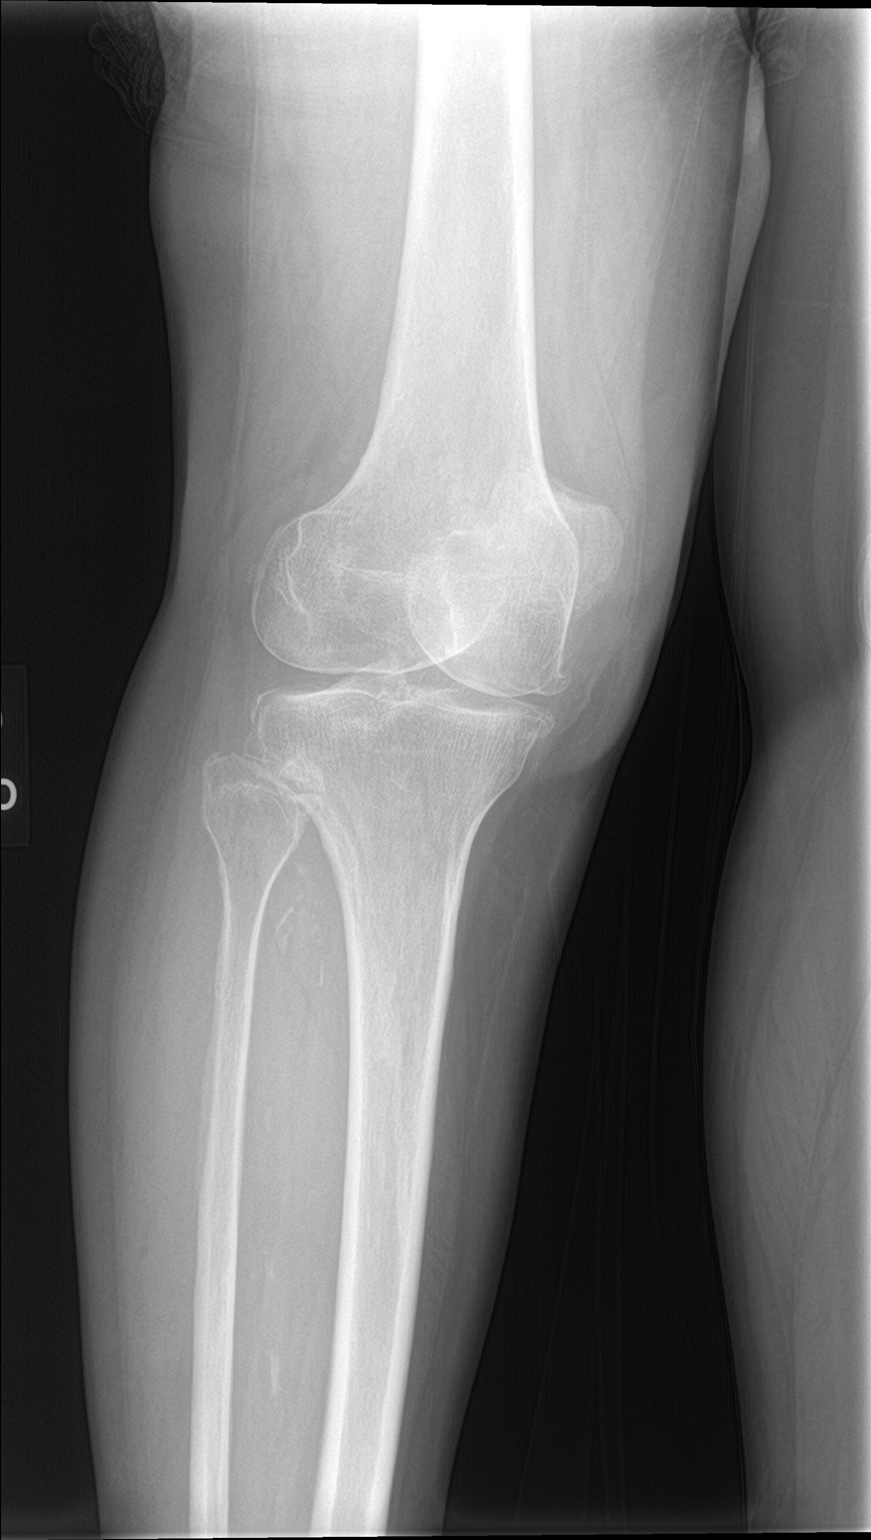

[knee lat]
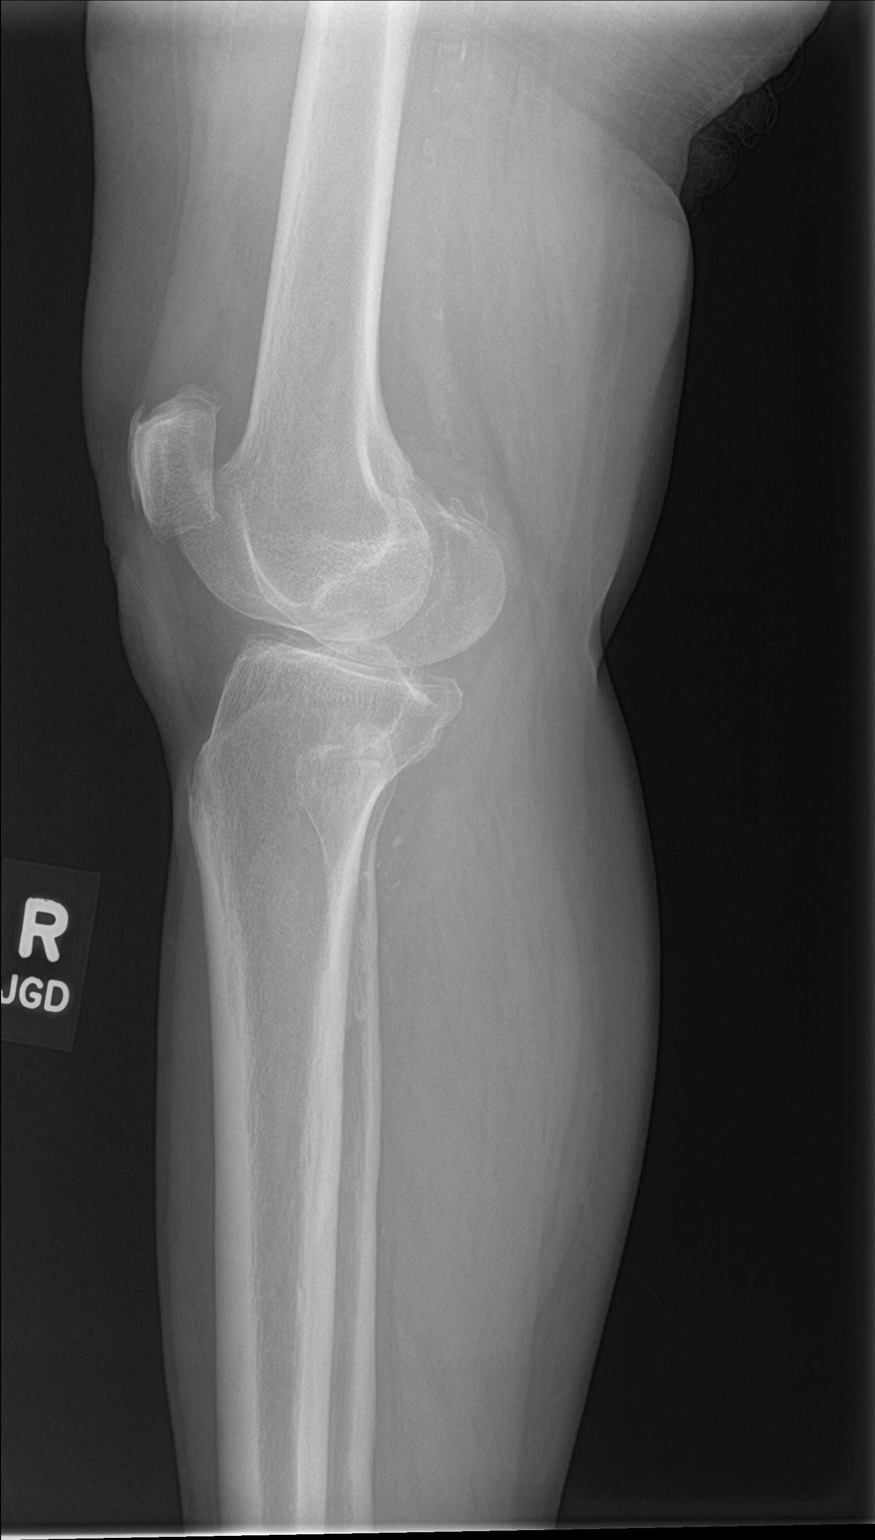

[4 of 4 positions shown; findings below may reference images not displayed]

FINDINGS: Mild tricompartment degenerative changes with joint space narrowing
and spurring. No joint effusion. No acute bony abnormality.
Specifically, no fracture, subluxation, or dislocation.
IMPRESSION: Mild degenerative changes.  No acute bony abnormality.

## 2024-03-28 LAB — COLOGUARD

## 2024-04-13 LAB — COLOGUARD: COLOGUARD: NEGATIVE

## 2024-11-18 ENCOUNTER — Other Ambulatory Visit: Payer: Self-pay

## 2024-11-18 ENCOUNTER — Observation Stay (HOSPITAL_COMMUNITY)
Admission: EM | Admit: 2024-11-18 | Discharge: 2024-11-21 | Disposition: A | Attending: Internal Medicine | Admitting: Internal Medicine

## 2024-11-18 ENCOUNTER — Emergency Department (HOSPITAL_COMMUNITY)

## 2024-11-18 ENCOUNTER — Encounter (HOSPITAL_COMMUNITY): Payer: Self-pay | Admitting: *Deleted

## 2024-11-18 DIAGNOSIS — E119 Type 2 diabetes mellitus without complications: Secondary | ICD-10-CM | POA: Insufficient documentation

## 2024-11-18 DIAGNOSIS — I6529 Occlusion and stenosis of unspecified carotid artery: Secondary | ICD-10-CM | POA: Insufficient documentation

## 2024-11-18 DIAGNOSIS — Z7982 Long term (current) use of aspirin: Secondary | ICD-10-CM | POA: Insufficient documentation

## 2024-11-18 DIAGNOSIS — I4581 Long QT syndrome: Secondary | ICD-10-CM | POA: Insufficient documentation

## 2024-11-18 DIAGNOSIS — I639 Cerebral infarction, unspecified: Secondary | ICD-10-CM | POA: Diagnosis present

## 2024-11-18 DIAGNOSIS — F17200 Nicotine dependence, unspecified, uncomplicated: Secondary | ICD-10-CM | POA: Insufficient documentation

## 2024-11-18 DIAGNOSIS — I6522 Occlusion and stenosis of left carotid artery: Secondary | ICD-10-CM | POA: Insufficient documentation

## 2024-11-18 DIAGNOSIS — I10 Essential (primary) hypertension: Secondary | ICD-10-CM | POA: Insufficient documentation

## 2024-11-18 DIAGNOSIS — I6502 Occlusion and stenosis of left vertebral artery: Secondary | ICD-10-CM | POA: Insufficient documentation

## 2024-11-18 DIAGNOSIS — E785 Hyperlipidemia, unspecified: Secondary | ICD-10-CM | POA: Insufficient documentation

## 2024-11-18 DIAGNOSIS — Z79899 Other long term (current) drug therapy: Secondary | ICD-10-CM | POA: Insufficient documentation

## 2024-11-18 DIAGNOSIS — G459 Transient cerebral ischemic attack, unspecified: Principal | ICD-10-CM | POA: Insufficient documentation

## 2024-11-18 DIAGNOSIS — F17210 Nicotine dependence, cigarettes, uncomplicated: Secondary | ICD-10-CM | POA: Insufficient documentation

## 2024-11-18 DIAGNOSIS — J45909 Unspecified asthma, uncomplicated: Secondary | ICD-10-CM | POA: Insufficient documentation

## 2024-11-18 LAB — COMPREHENSIVE METABOLIC PANEL WITH GFR
ALT: 21 U/L (ref 0–44)
AST: 23 U/L (ref 15–41)
Albumin: 3.9 g/dL (ref 3.5–5.0)
Alkaline Phosphatase: 64 U/L (ref 38–126)
Anion gap: 10 (ref 5–15)
BUN: 21 mg/dL (ref 8–23)
CO2: 30 mmol/L (ref 22–32)
Calcium: 9.3 mg/dL (ref 8.9–10.3)
Chloride: 102 mmol/L (ref 98–111)
Creatinine, Ser: 1.09 mg/dL — ABNORMAL HIGH (ref 0.44–1.00)
GFR, Estimated: 55 mL/min — ABNORMAL LOW (ref >60)
Glucose, Bld: 104 mg/dL — ABNORMAL HIGH (ref 70–99)
Potassium: 3.5 mmol/L (ref 3.5–5.1)
Sodium: 142 mmol/L (ref 135–145)
Total Bilirubin: 0.4 mg/dL (ref 0.0–1.2)
Total Protein: 6.8 g/dL (ref 6.5–8.1)

## 2024-11-18 LAB — CBC
HCT: 38.3 % (ref 36.0–46.0)
Hemoglobin: 12 g/dL (ref 12.0–15.0)
MCH: 26 pg (ref 26.0–34.0)
MCHC: 31.3 g/dL (ref 30.0–36.0)
MCV: 82.9 fL (ref 80.0–100.0)
Platelets: 395 K/uL (ref 150–400)
RBC: 4.62 MIL/uL (ref 3.87–5.11)
RDW: 15.9 % — ABNORMAL HIGH (ref 11.5–15.5)
WBC: 9.8 K/uL (ref 4.0–10.5)
nRBC: 0 % (ref 0.0–0.2)

## 2024-11-18 LAB — PROTIME-INR
INR: 1 (ref 0.8–1.2)
Prothrombin Time: 13.7 s (ref 11.4–15.2)

## 2024-11-18 NOTE — ED Provider Triage Note (Signed)
 Emergency Medicine Provider Triage Evaluation Note  CARLITA WHITCOMB , a 68 y.o. female  was evaluated in triage.  Pt complains of leg weakness. Felt fine yesterday, woke up this AM noticing numbness and weakness to RLE with some mild pain.  Was having difficulty moving and walking but that has since mostly abated. No headache, confusion, neck pain, back pain.  No hx of prior stroke  Review of Systems  Positive: As above Negative: As above  Physical Exam  BP 139/81 (BP Location: Right Arm)   Pulse 87   Temp 97.9 F (36.6 C)   Resp 20   SpO2 100%  Gen:   Awake, no distress   Resp:  Normal effort  MSK:   Moves extremities without difficulty  Other:  5/5 strength to all 4 extremities, steady gait, no focal deficit  Medical Decision Making  Medically screening exam initiated at 9:22 PM.  Appropriate orders placed.  PRESLYNN BIER was informed that the remainder of the evaluation will be completed by another provider, this initial triage assessment does not replace that evaluation, and the importance of remaining in the ED until their evaluation is complete.  olen Nivia Colon, PA-C 11/18/24 2124

## 2024-11-18 NOTE — ED Triage Notes (Addendum)
 Patient states that she was at work tonight and he right leg was giving her problems and she was unable to walk well. She states it intermittent weakness. She states that she is having some numbness and tingling in that leg also that is intermittent. She states it is feeling better on arrival to ED.    She reports that this has been ongoing since 9am this morning. Denies any other symptoms.

## 2024-11-18 NOTE — ED Triage Notes (Signed)
 Pt arrived POV for intermittent numbness in right leg. Reports she woke up this morning at 9am with numbness that resolved on its own. Tonight around 7pm while at work started having right leg numbness again that has since resolved. NIH 0 on exam, denies numbness in leg, no other neuro deficits

## 2024-11-19 ENCOUNTER — Emergency Department (HOSPITAL_COMMUNITY)

## 2024-11-19 ENCOUNTER — Observation Stay (HOSPITAL_COMMUNITY)

## 2024-11-19 DIAGNOSIS — I6502 Occlusion and stenosis of left vertebral artery: Secondary | ICD-10-CM | POA: Insufficient documentation

## 2024-11-19 DIAGNOSIS — E785 Hyperlipidemia, unspecified: Secondary | ICD-10-CM | POA: Insufficient documentation

## 2024-11-19 DIAGNOSIS — I361 Nonrheumatic tricuspid (valve) insufficiency: Secondary | ICD-10-CM

## 2024-11-19 DIAGNOSIS — I10 Essential (primary) hypertension: Secondary | ICD-10-CM | POA: Insufficient documentation

## 2024-11-19 DIAGNOSIS — I6522 Occlusion and stenosis of left carotid artery: Secondary | ICD-10-CM | POA: Insufficient documentation

## 2024-11-19 DIAGNOSIS — J45909 Unspecified asthma, uncomplicated: Secondary | ICD-10-CM | POA: Insufficient documentation

## 2024-11-19 DIAGNOSIS — I6529 Occlusion and stenosis of unspecified carotid artery: Secondary | ICD-10-CM | POA: Insufficient documentation

## 2024-11-19 DIAGNOSIS — I340 Nonrheumatic mitral (valve) insufficiency: Secondary | ICD-10-CM

## 2024-11-19 DIAGNOSIS — F17200 Nicotine dependence, unspecified, uncomplicated: Secondary | ICD-10-CM | POA: Insufficient documentation

## 2024-11-19 DIAGNOSIS — I639 Cerebral infarction, unspecified: Secondary | ICD-10-CM | POA: Diagnosis present

## 2024-11-19 LAB — MAGNESIUM: Magnesium: 2.2 mg/dL (ref 1.7–2.4)

## 2024-11-19 LAB — BASIC METABOLIC PANEL WITH GFR
Anion gap: 9 (ref 5–15)
BUN: 22 mg/dL (ref 8–23)
CO2: 26 mmol/L (ref 22–32)
Calcium: 8.6 mg/dL — ABNORMAL LOW (ref 8.9–10.3)
Chloride: 103 mmol/L (ref 98–111)
Creatinine, Ser: 0.89 mg/dL (ref 0.44–1.00)
GFR, Estimated: >60 mL/min (ref >60)
Glucose, Bld: 99 mg/dL (ref 70–99)
Potassium: 3.7 mmol/L (ref 3.5–5.1)
Sodium: 138 mmol/L (ref 135–145)

## 2024-11-19 LAB — CBC
HCT: 34.4 % — ABNORMAL LOW (ref 36.0–46.0)
Hemoglobin: 11 g/dL — ABNORMAL LOW (ref 12.0–15.0)
MCH: 26.3 pg (ref 26.0–34.0)
MCHC: 32 g/dL (ref 30.0–36.0)
MCV: 82.1 fL (ref 80.0–100.0)
Platelets: 317 K/uL (ref 150–400)
RBC: 4.19 MIL/uL (ref 3.87–5.11)
RDW: 16.1 % — ABNORMAL HIGH (ref 11.5–15.5)
WBC: 8.5 K/uL (ref 4.0–10.5)
nRBC: 0 % (ref 0.0–0.2)

## 2024-11-19 LAB — ECHOCARDIOGRAM COMPLETE
AR max vel: 2.13 cm2
AV Area VTI: 2.51 cm2
AV Area mean vel: 2.24 cm2
AV Mean grad: 4 mmHg
AV Peak grad: 7.5 mmHg
Ao pk vel: 1.37 m/s
Area-P 1/2: 3.68 cm2
Calc EF: 50.7 %
MV VTI: 2.38 cm2
S' Lateral: 3.1 cm
Single Plane A2C EF: 57.9 %
Single Plane A4C EF: 43.2 %

## 2024-11-19 LAB — GLUCOSE, CAPILLARY
Glucose-Capillary: 82 mg/dL (ref 70–99)
Glucose-Capillary: 96 mg/dL (ref 70–99)
Glucose-Capillary: 179 mg/dL — ABNORMAL HIGH (ref 70–99)

## 2024-11-19 LAB — LIPID PANEL
Cholesterol: 194 mg/dL (ref 0–200)
HDL: 54 mg/dL (ref >40)
LDL Cholesterol: 111 mg/dL — ABNORMAL HIGH (ref 0–99)
Total CHOL/HDL Ratio: 3.6 ratio
Triglycerides: 145 mg/dL (ref <150)
VLDL: 29 mg/dL (ref 0–40)

## 2024-11-19 LAB — HIV ANTIBODY (ROUTINE TESTING W REFLEX): HIV Screen 4th Generation wRfx: NONREACTIVE

## 2024-11-19 MED ORDER — PANTOPRAZOLE SODIUM 40 MG PO TBEC
40.0000 mg | DELAYED_RELEASE_TABLET | Freq: Every day | ORAL | Status: DC
  Administered 2024-11-19 – 2024-11-21 (×3): 40 mg via ORAL
  Filled 2024-11-19 (×3): qty 1

## 2024-11-19 MED ORDER — NICOTINE 14 MG/24HR TD PT24
14.0000 mg | MEDICATED_PATCH | Freq: Every day | TRANSDERMAL | Status: DC
  Administered 2024-11-19 – 2024-11-21 (×3): 14 mg via TRANSDERMAL
  Filled 2024-11-19 (×3): qty 1

## 2024-11-19 MED ORDER — NICOTINE POLACRILEX 2 MG MT GUM: 2.0000 mg | CHEWING_GUM | OROMUCOSAL | Status: DC | PRN

## 2024-11-19 MED ORDER — PNEUMOCOCCAL 20-VAL CONJ VACC 0.5 ML IM SUSY
0.5000 mL | PREFILLED_SYRINGE | INTRAMUSCULAR | Status: AC
  Administered 2024-11-20: 0.5 mL via INTRAMUSCULAR
  Filled 2024-11-19: qty 0.5

## 2024-11-19 MED ORDER — ALBUTEROL SULFATE (2.5 MG/3ML) 0.083% IN NEBU: 2.5000 mg | INHALATION_SOLUTION | RESPIRATORY_TRACT | Status: DC | PRN

## 2024-11-19 MED ORDER — ATORVASTATIN CALCIUM 40 MG PO TABS
40.0000 mg | ORAL_TABLET | Freq: Every day | ORAL | Status: DC
  Administered 2024-11-19: 40 mg via ORAL
  Filled 2024-11-19: qty 1

## 2024-11-19 MED ORDER — STROKE: EARLY STAGES OF RECOVERY BOOK
Freq: Once | Status: AC
  Filled 2024-11-19: qty 1

## 2024-11-19 MED ORDER — ACETAMINOPHEN 650 MG RE SUPP: 650.0000 mg | RECTAL | Status: DC | PRN

## 2024-11-19 MED ORDER — INSULIN ASPART 100 UNIT/ML IJ SOLN: 0.0000 [IU] | Freq: Every day | INTRAMUSCULAR | Status: DC

## 2024-11-19 MED ORDER — MELATONIN 5 MG PO TABS: 5.0000 mg | ORAL_TABLET | Freq: Every evening | ORAL | Status: DC | PRN

## 2024-11-19 MED ORDER — ACETAMINOPHEN 325 MG PO TABS: 650.0000 mg | ORAL_TABLET | ORAL | Status: DC | PRN

## 2024-11-19 MED ORDER — IOHEXOL 350 MG/ML SOLN
75.0000 mL | Freq: Once | INTRAVENOUS | Status: AC | PRN
  Administered 2024-11-19: 75 mL via INTRAVENOUS

## 2024-11-19 MED ORDER — ENOXAPARIN SODIUM 40 MG/0.4ML IJ SOSY
40.0000 mg | PREFILLED_SYRINGE | INTRAMUSCULAR | Status: DC
  Administered 2024-11-19 – 2024-11-21 (×3): 40 mg via SUBCUTANEOUS
  Filled 2024-11-19 (×3): qty 0.4

## 2024-11-19 MED ORDER — ASPIRIN 81 MG PO TBEC
81.0000 mg | DELAYED_RELEASE_TABLET | Freq: Every day | ORAL | Status: DC
  Administered 2024-11-19 – 2024-11-21 (×3): 81 mg via ORAL
  Filled 2024-11-19 (×3): qty 1

## 2024-11-19 MED ORDER — CLOPIDOGREL BISULFATE 75 MG PO TABS
75.0000 mg | ORAL_TABLET | Freq: Every day | ORAL | Status: DC
  Administered 2024-11-19 – 2024-11-21 (×3): 75 mg via ORAL
  Filled 2024-11-19 (×3): qty 1

## 2024-11-19 MED ORDER — INSULIN ASPART 100 UNIT/ML IJ SOLN
0.0000 [IU] | Freq: Three times a day (TID) | INTRAMUSCULAR | Status: DC
  Administered 2024-11-20: 2 [IU] via SUBCUTANEOUS
  Filled 2024-11-19: qty 3

## 2024-11-19 MED ORDER — SENNOSIDES-DOCUSATE SODIUM 8.6-50 MG PO TABS: 1.0000 | ORAL_TABLET | Freq: Every evening | ORAL | Status: DC | PRN

## 2024-11-19 MED ORDER — ACETAMINOPHEN 500 MG PO TABS
1000.0000 mg | ORAL_TABLET | Freq: Once | ORAL | Status: AC
  Administered 2024-11-19: 1000 mg via ORAL
  Filled 2024-11-19: qty 2

## 2024-11-19 MED ORDER — ACETAMINOPHEN 160 MG/5ML PO SOLN: 650.0000 mg | ORAL | Status: DC | PRN

## 2024-11-19 MED ORDER — ATORVASTATIN CALCIUM 80 MG PO TABS
80.0000 mg | ORAL_TABLET | Freq: Every day | ORAL | Status: DC
  Administered 2024-11-20 – 2024-11-21 (×2): 80 mg via ORAL
  Filled 2024-11-19 (×2): qty 1

## 2024-11-19 MED ORDER — POTASSIUM CHLORIDE CRYS ER 20 MEQ PO TBCR
30.0000 meq | EXTENDED_RELEASE_TABLET | Freq: Two times a day (BID) | ORAL | Status: DC
  Administered 2024-11-19 (×2): 30 meq via ORAL
  Filled 2024-11-19 (×2): qty 1

## 2024-11-19 MED ORDER — GADOBUTROL 1 MMOL/ML IV SOLN
10.0000 mL | Freq: Once | INTRAVENOUS | Status: AC | PRN
  Administered 2024-11-19: 10 mL via INTRAVENOUS

## 2024-11-19 NOTE — Plan of Care (Signed)

## 2024-11-19 NOTE — Care Management Obs Status (Signed)
 MEDICARE OBSERVATION STATUS NOTIFICATION   Patient Details  Name: Kaylee Campbell MRN: [id number] Date of Birth: 1956/04/12   Medicare Observation Status Notification Given:     Obs notice signed and copy given.   Gabryel Talamo 11/19/2024, 4:09 PM

## 2024-11-19 NOTE — ED Provider Notes (Signed)
 [facility] Emergency Department Provider Note MRN:  [id number]  Arrival date & time: 11/19/24     Chief Complaint   Extremity Weakness   History of Present Illness   Kaylee Campbell is a 68 y.o. year-old female with a history of hyperlipidemia, hypertension presenting to the ED with chief complaint of extremity weakness.  Weakness and numbness to the right lower extremity upon awakening this morning, trouble walking due to this.  Resolved over several minutes.  Came back at about 7 PM and has resolved again.  Currently feels normal.  Denies any pain, no arm numbness or weakness, no trouble with speech or vision.  Review of Systems  A thorough review of systems was obtained and all systems are negative except as noted in the HPI and PMH.   Patient's Health History    Past Medical History:  Diagnosis Date   Asthma    Hyperlipemia    Hypertension     Past Surgical History:  Procedure Laterality Date   ABDOMINAL HYSTERECTOMY     CESAREAN SECTION     UTERINE FIBROID SURGERY      Family History  Problem Relation Age of Onset   Diabetes Father    Hypertension Father    Cancer Mother    Heart failure Mother    Hypertension Mother     Social History   Socioeconomic History   Marital status: Married    Spouse name: Not on file   Number of children: Not on file   Years of education: Not on file   Highest education level: Not on file  Occupational History   Not on file  Tobacco Use   Smoking status: Every Day    Types: Cigarettes   Smokeless tobacco: Never  Substance and Sexual Activity   Alcohol use: Yes   Drug use: Not on file   Sexual activity: Not on file  Other Topics Concern   Not on file  Social History Narrative   Not on file   Social Drivers of Health   Financial Resource Strain: Not on file  Food Insecurity: Not on file  Transportation Needs: Not on file  Physical Activity: Not on file  Stress: Not on file  Social  Connections: Not on file  Intimate Partner Violence: Not on file     Physical Exam   Vitals:   11/19/24 0030 11/19/24 0100  BP: (!) 151/83 (!) 139/92  Pulse:  69  Resp: 13 17  Temp:    SpO2:  96%    CONSTITUTIONAL: Well-appearing, NAD NEURO/PSYCH:  Alert and oriented x 3, normal and symmetric strength and sensation, normal coordination, normal speech EYES:  eyes equal and reactive ENT/NECK:  no LAD, no JVD CARDIO: Regular rate, well-perfused, normal S1 and S2 PULM:  CTAB no wheezing or rhonchi GI/GU:  non-distended, non-tender MSK/SPINE:  No gross deformities, no edema SKIN:  no rash, atraumatic   *Additional and/or pertinent findings included in MDM below  Diagnostic and Interventional Summary    EKG Interpretation Date/Time:  Tuesday November 19 2024 01:27:12 EST Ventricular Rate:  65 PR Interval:  184 QRS Duration:  95 QT Interval:  550 QTC Calculation: 572 R Axis:   -46  Text Interpretation: Sinus rhythm Left anterior fascicular block Abnormal R-wave progression, early transition Left ventricular hypertrophy Prolonged QT interval Confirmed by Theadore Sharper [id number]) on 11/19/2024 2:27:02 AM       Labs Reviewed  CBC - Abnormal; Notable for the following components:  Result Value   RDW 15.9 (*)    All other components within normal limits  COMPREHENSIVE METABOLIC PANEL WITH GFR - Abnormal; Notable for the following components:   Glucose, Bld 104 (*)    Creatinine, Ser 1.09 (*)    GFR, Estimated 55 (*)    All other components within normal limits  PROTIME-INR  LIPID PANEL    CT ANGIO HEAD NECK W WO CM  Final Result    MR BRAIN WO CONTRAST  Final Result    CT HEAD WO CONTRAST  Final Result      Medications  acetaminophen  (TYLENOL ) tablet 1,000 mg (1,000 mg Oral Given 11/19/24 0237)  iohexol (OMNIPAQUE) 350 MG/ML injection 75 mL (75 mLs Intravenous Contrast Given 11/19/24 0237)     Procedures  /  Critical Care Procedures  ED Course and Medical  Decision Making  Initial Impression and Ddx Question TIA versus MSK versus peripheral neuropathy versus does have cardiovascular risk factors, will consult neurology for recommendations regarding disposition, MRI.  Past medical/surgical history that increases complexity of ED encounter: Hypertension, hyperlipidemia  Interpretation of Diagnostics I personally reviewed the EKG and my interpretation is as follows: Sinus rhythm without concerning features  No significant blood count or electrolyte disturbance.  Patient Reassessment and Ultimate Disposition/Management     1 AM update: Consulted neurology recommending admission, MRI, CTA for TIA workup.  Will see if patient is willing to stay, had expressed some hesitancy with admission due to family obligations  3 AM update: Patient agrees to stay for admission.  Patient management required discussion with the following services or consulting groups:  Hospitalist Service and Neurology  Complexity of Problems Addressed Acute illness or injury that poses threat of life of bodily function  Additional Data Reviewed and Analyzed Further history obtained from: Prior labs/imaging results  Additional Factors Impacting ED Encounter Risk Consideration of hospitalization  Ozell HERO. Theadore, MD [facility] Emergency Medicine [facility] [email or web address]  Final Clinical Impressions(s) / ED Diagnoses     ICD-10-CM   1. TIA (transient ischemic attack)  G45.9       ED Discharge Orders     None        Discharge Instructions Discussed with and Provided to Patient:   Discharge Instructions   None      Theadore Ozell HERO, MD 11/19/24 0301

## 2024-11-19 NOTE — Progress Notes (Signed)
  Echocardiogram 2D Echocardiogram has been performed.  Norleen ORN [facility] 11/19/2024, 9:38 AM

## 2024-11-19 NOTE — ED Notes (Signed)
 Hospitalist NP at assessing patient and discussing plan of care.

## 2024-11-19 NOTE — Discharge Instructions (Signed)
 Dear Kaylee Campbell,   Congratulations for your interest in quitting smoking!  Find a program that suits you best: when you want to quit, how you need support, where you live, and how you like to learn.    If you're ready to get started TODAY, consider scheduling a visit through [facility] @ [email or web address].  Appointments are available from 8am to 8pm, Monday to Friday.   Most health insurance plans will cover some level of tobacco cessation visits and medications.    Additional Resources: [employer] are also available to help you quit & provide the support you'll need. Many programs are available in both English and Spanish and have a long history of successfully helping people get off and stay off tobacco.    Quit Smoking Apps:  quitSTART at seriousbroker.de QuitGuide?at forgetparking.dk Online education and resources: Smokefree  at borders group.gov Free Telephone Coaching: QuitNow,  Call 1-800-QUIT-NOW ((513) 638-5312) or Text- Ready to 417-574-3410 *Quitline [place] has teamed up with Medicaid to offer a free 14 week program    Vaping- Want to Quit? Free 24/7 support. Call [facility]  [place], [address], [place], [place], [place]  O[place]      Follow with Primary MD Gorge Ade, MD in 7 days   Get CBC, CMP, Magnesium, 2 view Chest X ray -  checked next visit with your primary MD   Activity: As tolerated with Full fall precautions use walker/cane & assistance as needed  Disposition Home    Diet: Heart Healthy    Special Instructions: If you have smoked or chewed Tobacco  in the last 2 yrs please stop smoking, stop any regular Alcohol  and or any Recreational drug use.  On your next visit with your primary care physician please Get Medicines reviewed and adjusted.  Please request your Prim.MD to go over all Hospital Tests and Procedure/Radiological results at the follow up, please get all Hospital records  sent to your Prim MD by signing hospital release before you go home.  If you experience worsening of your admission symptoms, develop shortness of breath, life threatening emergency, suicidal or homicidal thoughts you must seek medical attention immediately by calling 911 or calling your MD immediately  if symptoms less severe.  You Must read complete instructions/literature along with all the possible adverse reactions/side effects for all the Medicines you take and that have been prescribed to you. Take any new Medicines after you have completely understood and accpet all the possible adverse reactions/side effects.   Do not drive when taking Pain medications.  Do not take more than prescribed Pain, Sleep and Anxiety Medications  Wear Seat belts while driving.

## 2024-11-19 NOTE — Evaluation (Signed)
 Physical Therapy Evaluation Patient Details Name: Kaylee Campbell MRN: [id number] DOB: August 22, 1956 Today's Date: 11/19/2024  History of Present Illness  68 yo female presents 12/2 with intermittent numbness R LE, NIH 0. MRI showed multiple small foci of acute/early subacute ischemia in R midrbrain and remote subacute infarct left frontal corona radiata. PMH includes: HLD, HTN, and asthma.   Clinical Impression  Pt in bed upon arrival of PT, agreeable to evaluation at this time. 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 The pt presents with mild deficits in strength in RLE and coordination in RUE and RLE. The pt was able to complete hallway ambulation with CGA to supervision, even with addition of balance challenge. The pt does ambulate with slowed speed and presents with deficits in short term memory, awareness, recall, and general safety awareness. The pt was educated on BE FAST signs and sx of a stroke, but was then unable to name any sx despite naming multiple people she knows with a stroke. Will continue to follow and recommend OP neuro PT to address deficits in coordination and strength, anticipate pt will be able to return home with family support once medically cleared.    If plan is discharge home, recommend the following: Assistance with cooking/housework;Direct supervision/assist for medications management;Direct supervision/assist for financial management;Supervision due to cognitive status;Assist for transportation   Can travel by private vehicle        Equipment Recommendations None recommended by PT  Recommendations for Other Services       Functional Status Assessment Patient has had a recent decline in their functional status and demonstrates the ability to make significant improvements in function in a reasonable and predictable amount of time.     Precautions / Restrictions  Precautions Precautions: Fall Recall of Precautions/Restrictions: Impaired Restrictions Weight Bearing Restrictions Per Provider Order: No      Mobility  Bed Mobility Overal bed mobility: Independent             General bed mobility comments: increased time, no assist    Transfers Overall transfer level: Needs assistance Equipment used: None Transfers: Sit to/from Stand Sit to Stand: Contact guard assist           General transfer comment: CGA for safety, no UE support    Ambulation/Gait Ambulation/Gait assistance: Contact guard assist, Supervision Gait Distance (Feet): 200 Feet Assistive device: None Gait Pattern/deviations: Step-through pattern, Decreased stride length Gait velocity: 0.63 m/s Gait velocity interpretation: 1.31 - 2.62 ft/sec, indicative of limited community ambulator   General Gait Details: pt with slowed gait, but improved within session. tolerated balance challenge well and progressed to supervision  Stairs Stairs: Yes Stairs assistance: Contact guard assist Stair Management: Step to pattern, Two rails, Forwards Number of Stairs: 1    Modified Rankin (Stroke Patients Only) Modified Rankin (Stroke Patients Only) Pre-Morbid Rankin Score: No symptoms Modified Rankin: Moderate disability     Balance Overall balance assessment: Needs assistance Sitting-balance support: No upper extremity supported, Feet supported Sitting balance-Leahy Scale: Good Sitting balance - Comments: able to lean outside BOS   Standing balance support: No upper extremity supported Standing balance-Leahy Scale: Fair                   Standardized Balance Assessment Standardized Balance Assessment : Dynamic Gait Index   Dynamic Gait Index Level Surface: Normal Change in Gait Speed: Normal Gait with Horizontal Head Turns: Mild Impairment Gait  with Vertical Head Turns: Mild Impairment Gait and Pivot Turn: Mild Impairment Step Over Obstacle: Mild  Impairment Step Around Obstacles: Normal Steps: Moderate Impairment Total Score: 18       Pertinent Vitals/Pain Pain Assessment Pain Assessment: No/denies pain    Home Living Family/patient expects to be discharged to:: Private residence Living Arrangements: Spouse/significant other;Children (grandson and husband) Available Help at Discharge: Family;Available 24 hours/day Type of Home: House Home Access: Level entry       Home Layout: One level Home Equipment: None      Prior Function Prior Level of Function : Driving;Working/employed             Mobility Comments: reports no use of DME, some stumbes no falls ADLs Comments: reports no issues, working part time cleaning at night     Extremity/Trunk Assessment   Upper Extremity Assessment Upper Extremity Assessment: Right hand dominant;RUE deficits/detail RUE Deficits / Details: grossly 4/5 to MMT with mild deficit in coordination, overshooting on FNF RUE Sensation: WNL RUE Coordination: decreased fine motor    Lower Extremity Assessment Lower Extremity Assessment: RLE deficits/detail RLE Deficits / Details: hip flexion and knee flexion 3+/5, knee extension and ankle DF 4-/5. reports no numbness or tingling RLE Sensation: WNL RLE Coordination: decreased fine motor    Cervical / Trunk Assessment Cervical / Trunk Assessment: Normal  Communication   Communication Communication: No apparent difficulties    Cognition Arousal: Alert Behavior During Therapy: Lability   PT - Cognitive impairments: Difficult to assess, Awareness, Memory, Attention, Safety/Judgement                       PT - Cognition Comments: pt reports no cognitive deficits, is oriented, but labile in session from angry at work situation, to tearful, to making jokes. pt able to answer questions and follow commands, but not recall 3 items after 5 min and demos poor recall of stroke signs and sx despite knowing multiple people with  strokes was unable to name any sx Following commands: Intact       Cueing Cueing Techniques: Verbal cues     General Comments      Exercises     Assessment/Plan    PT Assessment Patient needs continued PT services  PT Problem List Decreased strength;Decreased activity tolerance;Decreased balance;Decreased mobility;Decreased coordination       PT Treatment Interventions DME instruction;Gait training;Stair training;Functional mobility training;Therapeutic activities;Therapeutic exercise;Balance training;Patient/family education    PT Goals (Current goals can be found in the Care Plan section)  Acute Rehab PT Goals Patient Stated Goal: to return home asap PT Goal Formulation: With patient Time For Goal Achievement: 12/03/24 Potential to Achieve Goals: Good    Frequency Min 2X/week        AM-PAC PT 6 Clicks Mobility  Outcome Measure Help needed turning from your back to your side while in a flat bed without using bedrails?: None Help needed moving from lying on your back to sitting on the side of a flat bed without using bedrails?: None Help needed moving to and from a bed to a chair (including a wheelchair)?: A Little Help needed standing up from a chair using your arms (e.g., wheelchair or bedside chair)?: A Little Help needed to walk in hospital room?: A Little Help needed climbing 3-5 steps with a railing? : A Little 6 Click Score: 20    End of Session Equipment Utilized During Treatment: Gait belt Activity Tolerance: Patient tolerated treatment well Patient left: in bed;with  call bell/phone within reach;with family/visitor present Nurse Communication: Mobility status PT Visit Diagnosis: Unsteadiness on feet (R26.81);Muscle weakness (generalized) (M62.81);Hemiplegia and hemiparesis Hemiplegia - Right/Left: Right Hemiplegia - dominant/non-dominant: Dominant Hemiplegia - caused by: Cerebral infarction    Time: 9171-9143 PT Time Calculation (min) (ACUTE ONLY):  28 min   Charges:   PT Evaluation $PT Eval Low Complexity: 1 Low PT Treatments $Gait Training: 8-22 mins PT General Charges $$ ACUTE PT VISIT: 1 Visit         Izetta Call, PT, DPT   Acute Rehabilitation Department Office [phone] Secure Chat Communication Preferred  Izetta JULIANNA Call 11/19/2024, 9:28 AM

## 2024-11-19 NOTE — ED Notes (Signed)
 Pt returned from MRI

## 2024-11-19 NOTE — ED Notes (Signed)
 Pt ambulated to restroom with steady gait, pt tolerated well.

## 2024-11-19 NOTE — Progress Notes (Signed)
  Echocardiogram report  reviewed:   Left Ventricle: Left ventricular ejection fraction, by estimation, is 45  to 50%. The left ventricle has mildly decreased function. The left  ventricle has no regional wall motion abnormalities. The left ventricular  internal cavity size was normal in  size. There is no left ventricular hypertrophy. Left ventricular diastolic  parameters are consistent with Grade I diastolic dysfunction (impaired  relaxation).    Mobile mass on the chordae suspicious for thrombus vs  calcificaficitation. May benefit from Cardiac MR in the settting of recent  CVA. SABRA The mitral valve is normal in structure.   Consult cardiology for possible TEE evaluation    SIGNED: Adriana DELENA Grams, MD, FHM. FAAFP [facility],  Pager (please use [email or web address] to page/text)  Please use Epic Secure Chat for non-urgent communication (7AM-7PM) If 7PM-7AM, please contact night-coverage [email or web address],  11/19/2024, 1:51 PM

## 2024-11-19 NOTE — Progress Notes (Addendum)
 STROKE TEAM PROGRESS NOTE    SIGNIFICANT HOSPITAL EVENTS 12/2 - Admitted  INTERIM HISTORY/SUBJECTIVE Pt reports that her last known well was about 1am 12/1, when she went to the bathroom overnight, and in the morning her R leg was painful and was turned inward.  She had difficulty walking she also reports numbness and tingling in that leg which has been present for a long time. Denies any other neurologic symptoms.  MRI scan of the brain shows acute patchy left cingulate gyrus as well as tiny right midbrain infarcts.  CT angiogram shows severe stenosis of left vertebral artery origin as well as ophthalmic segment of the left ICA.  Mild 50% narrowing of both carotid bifurcations. OBJECTIVE  CBC    Component Value Date/Time   WBC 8.5 11/19/2024 0814   RBC 4.19 11/19/2024 0814   HGB 11.0 (L) 11/19/2024 0814   HCT 34.4 (L) 11/19/2024 0814   PLT 317 11/19/2024 0814   MCV 82.1 11/19/2024 0814   MCH 26.3 11/19/2024 0814   MCHC 32.0 11/19/2024 0814   RDW 16.1 (H) 11/19/2024 0814    BMET    Component Value Date/Time   NA 138 11/19/2024 0814   K 3.7 11/19/2024 0814   CL 103 11/19/2024 0814   CO2 26 11/19/2024 0814   GLUCOSE 99 11/19/2024 0814   BUN 22 11/19/2024 0814   CREATININE 0.89 11/19/2024 0814   CALCIUM 8.6 (L) 11/19/2024 0814   GFRNONAA >60 11/19/2024 0814    IMAGING past 24 hours ECHOCARDIOGRAM COMPLETE Result Date: 11/19/2024    ECHOCARDIOGRAM REPORT   Patient Name:   Kaylee Campbell Date of Exam: 11/19/2024 Medical Rec #:  [id number]      Height:       62.0 in Accession #:    [id number]     Weight:       149.4 lb Date of Birth:  1956/09/28      BSA:          1.689 m Patient Age:    68 years       BP:           135/72 mmHg Patient Gender: F              HR:           70 bpm. Exam Location:  Inpatient Procedure: 2D Echo (Both Spectral and Color Flow Doppler were utilized during            procedure). Indications:     Stroke  History:         Patient has no prior history of  Echocardiogram examinations.                  Stroke.  Sonographer:     Norleen Amour Referring Phys:  [id number] KATY L FOUST Diagnosing Phys: Kardie Tobb DO IMPRESSIONS  1. Left ventricular ejection fraction, by estimation, is 45 to 50%. The left ventricle has mildly decreased function. The left ventricle has no regional wall motion abnormalities. Left ventricular diastolic parameters are consistent with Grade I diastolic dysfunction (impaired relaxation).  2. Right ventricular systolic function is normal. The right ventricular size is normal. Tricuspid regurgitation signal is inadequate for assessing PA pressure.  3. Mobile mass on the chordae suspicious for thrombus vs calcificaficitation. May benefit from Cardiac MR in the settting of recent CVA. SABRA The mitral valve is normal in structure. Mild mitral valve regurgitation. No evidence of mitral stenosis.  4. The aortic valve is normal  in structure. Aortic valve regurgitation is not visualized. No aortic stenosis is present.  5. The inferior vena cava is normal in size with greater than 50% respiratory variability, suggesting right atrial pressure of 3 mmHg. FINDINGS  Left Ventricle: Left ventricular ejection fraction, by estimation, is 45 to 50%. The left ventricle has mildly decreased function. The left ventricle has no regional wall motion abnormalities. The left ventricular internal cavity size was normal in size. There is no left ventricular hypertrophy. Left ventricular diastolic parameters are consistent with Grade I diastolic dysfunction (impaired relaxation). Right Ventricle: The right ventricular size is normal. No increase in right ventricular wall thickness. Right ventricular systolic function is normal. Tricuspid regurgitation signal is inadequate for assessing PA pressure. Left Atrium: Left atrial size was normal in size. Right Atrium: Right atrial size was normal in size. Pericardium: There is no evidence of pericardial effusion. Presence of epicardial  fat layer. Mitral Valve: Mobile mass on the chordae suspicious for thrombus vs calcificaficitation. May benefit from Cardiac MR in the settting of recent CVA. The mitral valve is normal in structure. Mild mitral valve regurgitation. No evidence of mitral valve stenosis. MV peak gradient, 3.5 mmHg. The mean mitral valve gradient is 2.0 mmHg. Tricuspid Valve: The tricuspid valve is normal in structure. Tricuspid valve regurgitation is not demonstrated. No evidence of tricuspid stenosis. Aortic Valve: The aortic valve is normal in structure. Aortic valve regurgitation is not visualized. No aortic stenosis is present. Aortic valve mean gradient measures 4.0 mmHg. Aortic valve peak gradient measures 7.5 mmHg. Aortic valve area, by VTI measures 2.51 cm. Pulmonic Valve: The pulmonic valve was normal in structure. Pulmonic valve regurgitation is not visualized. No evidence of pulmonic stenosis. Aorta: The aortic root is normal in size and structure. Venous: The inferior vena cava is normal in size with greater than 50% respiratory variability, suggesting right atrial pressure of 3 mmHg. IAS/Shunts: No atrial level shunt detected by color flow Doppler.  LEFT VENTRICLE PLAX 2D LVIDd:         5.00 cm     Diastology LVIDs:         3.10 cm     LV e' medial:    5.61 cm/s LV PW:         0.90 cm     LV E/e' medial:  18.0 LV IVS:        1.00 cm     LV e' lateral:   10.30 cm/s LVOT diam:     2.20 cm     LV E/e' lateral: 9.8 LV SV:         68 LV SV Index:   40 LVOT Area:     3.80 cm LV IVRT:       106 msec  LV Volumes (MOD) LV vol d, MOD A2C: 56.1 ml LV vol d, MOD A4C: 83.3 ml LV vol s, MOD A2C: 23.6 ml LV vol s, MOD A4C: 47.3 ml LV SV MOD A2C:     32.5 ml LV SV MOD A4C:     83.3 ml LV SV MOD BP:      35.2 ml RIGHT VENTRICLE RV Basal diam:  3.40 cm     PULMONARY VEINS RV S prime:     13.40 cm/s  Diastolic Velocity: 52.30 cm/s TAPSE (M-mode): 1.9 cm      S/D Velocity:       0.80  Systolic Velocity:  43.20  cm/s LEFT ATRIUM             Index        RIGHT ATRIUM           Index LA diam:        3.70 cm 2.19 cm/m   RA Area:     13.20 cm LA Vol (A2C):   52.4 ml 31.03 ml/m  RA Volume:   32.90 ml  19.48 ml/m LA Vol (A4C):   40.2 ml 23.80 ml/m LA Biplane Vol: 46.4 ml 27.48 ml/m  AORTIC VALVE                    PULMONIC VALVE AV Area (Vmax):    2.13 cm     PV Vmax:       1.13 m/s AV Area (Vmean):   2.24 cm     PV Peak grad:  5.1 mmHg AV Area (VTI):     2.51 cm AV Vmax:           137.00 cm/s AV Vmean:          98.600 cm/s AV VTI:            0.270 m AV Peak Grad:      7.5 mmHg AV Mean Grad:      4.0 mmHg LVOT Vmax:         76.60 cm/s LVOT Vmean:        58.000 cm/s LVOT VTI:          0.178 m LVOT/AV VTI ratio: 0.66  AORTA Ao Root diam: 2.50 cm Ao Asc diam:  2.90 cm MITRAL VALVE MV Area (PHT): 3.68 cm     SHUNTS MV Area VTI:   2.38 cm     Systemic VTI:  0.18 m MV Peak grad:  3.5 mmHg     Systemic Diam: 2.20 cm MV Mean grad:  2.0 mmHg MV Vmax:       0.93 m/s MV Vmean:      65.7 cm/s MV Decel Time: 206 msec MV E velocity: 101.00 cm/s MV A velocity: 91.70 cm/s MV E/A ratio:  1.10 Kardie Tobb DO Electronically signed by Dub Huntsman DO Signature Date/Time: 11/19/2024/9:43:41 AM    Final (Updated)    CT ANGIO HEAD NECK W WO CM Result Date: 11/19/2024 EXAM: CTA HEAD AND NECK WITHOUT AND WITH IV CONTRAST 11/19/2024 02:36:38 AM TECHNIQUE: CTA of the head and neck was performed without and with the administration of 75 mL of iohexol (OMNIPAQUE) 350 mg/mL injection. Multiplanar 2D and/or 3D reformatted images are provided for review. Automated exposure control, iterative reconstruction, and/or weight based adjustment of the mA/kV was utilized to reduce the radiation dose to as low as reasonably achievable. Stenosis of the internal carotid arteries measured using NASCET criteria. COMPARISON: Brain MRI 11/19/2024 CLINICAL HISTORY: Transient ischemic attack (TIA) FINDINGS: CTA NECK: AORTIC ARCH AND ARCH VESSELS: Aortic  atherosclerosis. No dissection or arterial injury. No significant stenosis of the brachiocephalic or subclavian arteries. CERVICAL CAROTID ARTERIES: Tortuous course of the right common carotid artery. Mild atherosclerosis at the right carotid bifurcation and proximal internal carotid artery with less than 50% stenosis. Atherosclerotic calcification at the left carotid bifurcation without hemodynamically significant stenosis. No dissection or arterial injury. 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 Normal right vertebral artery. Mild atherosclerotic calcification of the left vertebral artery V4 segment. No dissection or arterial injury. LUNGS AND  MEDIASTINUM: Unremarkable. SOFT TISSUES: No acute abnormality. BONES: No acute abnormality. CTA HEAD: ANTERIOR CIRCULATION: Atherosclerotic calcification of the carotid siphons with mild stenoses of the cavernous segments. 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 No significant stenosis of the anterior cerebral arteries. No significant stenosis of the middle cerebral arteries. No aneurysm. POSTERIOR CIRCULATION: No significant stenosis of the posterior cerebral arteries. No significant stenosis of the basilar artery. No significant stenosis of the vertebral arteries. No aneurysm. OTHER: No dural venous sinus thrombosis on this non-dedicated study. IMPRESSION: 1. No emergent large vessel occlusion. 2. 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 3. 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 4. Mild atherosclerosis at the carotid bifurcations with less than 50% stenosis. Electronically signed by: Franky Stanford MD 11/19/2024 02:54 AM EST RP Workstation: [id number]   MR BRAIN WO CONTRAST Result Date: 11/19/2024 EXAM: MRI BRAIN WITHOUT  CONTRAST 11/19/2024 02:07:30 AM TECHNIQUE: Multiplanar multisequence MRI of the head/brain was performed without the administration of intravenous contrast. COMPARISON: None available. CLINICAL HISTORY: Neuro deficit, acute, stroke suspected. FINDINGS: BRAIN AND VENTRICLES: Multiple small foci of abnormal diffusion restriction are present, including 1 along the interhemispheric fissure at the cingulate gyrus and another punctate lesion within the right midbrain. There is an area of hyperintensity on diffusion-weighted imaging within the left frontal corona radiata with no ADC correlate. 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 No intracranial hemorrhage. No mass. No midline shift. No hydrocephalus. The sella is unremarkable. Normal flow voids. ORBITS: No acute abnormality. SINUSES AND MASTOIDS: No acute abnormality. BONES AND SOFT TISSUES: Normal marrow signal. No acute soft tissue abnormality. IMPRESSION: 1. Multiple small foci of acute/early subacute ischemia, including along the interhemispheric fissure at the cingulate gyrus and a punctate lesion within the right midbrain. 2. 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 Electronically signed by: Franky Stanford MD 11/19/2024 02:21 AM EST RP Workstation: [id number]   CT HEAD WO CONTRAST Result Date: 11/18/2024 CLINICAL DATA:  Intermittent right leg numbness. EXAM: CT HEAD WITHOUT CONTRAST TECHNIQUE: Contiguous axial images were obtained from the base of the skull through the vertex without intravenous contrast. RADIATION DOSE REDUCTION: This exam was performed according to the departmental dose-optimization program which includes automated exposure control, adjustment of the mA and/or kV according to patient size and/or use of iterative reconstruction technique. COMPARISON:  None Available. FINDINGS: Brain: No evidence of acute infarction, hemorrhage, hydrocephalus,  extra-axial collection or mass lesion/mass effect. Vascular: No hyperdense vessel or unexpected calcification. Skull: Normal. Negative for fracture or focal lesion. Sinuses/Orbits: No acute finding. Other: None. IMPRESSION: No acute intracranial pathology. Electronically Signed   By: Suzen Dials M.D.   On: 11/18/2024 22:51    Vitals:   11/19/24 1000 11/19/24 1005 11/19/24 1129 11/19/24 1156  BP: 129/74  129/74 (!) 143/87  Pulse: 71  74   Resp: 20  19 19   Temp:  98 F (36.7 C) 98 F (36.7 C) 98.2 F (36.8 C)  TempSrc:  Oral Oral   SpO2: 98%  99% 98%    PHYSICAL EXAM General:  Alert, well-nourished, well-developed patient in no acute distress Psych:  Mood and affect appropriate for situation CV: Regular rate and rhythm on monitor Respiratory:  Regular, unlabored respirations on room air GI: Abdomen soft and nontender   NEURO:  Mental Status: AA&Ox3, patient is  able to give clear and coherent history Speech/Language: speech is without dysarthria or aphasia.  Naming, repetition, fluency, and comprehension intact.  Cranial Nerves:  II: PERRL. Visual fields full.  III, IV, VI: EOMI. Eyelids elevate symmetrically.  V: Sensation is intact to light touch and symmetrical to face.  VII: Face is symmetrical resting and smiling VIII: hearing intact to voice. IX, X: Palate elevates symmetrically. Phonation is normal.  KP:Dynloizm shrug 5/5. XII: tongue is midline without fasciculations. Motor: 5/5 strength to all muscle groups tested.  Tone: is normal and bulk is normal Sensation- Intact to light touch bilaterally. Extinction absent to light touch to DSS.   Coordination: FTN intact bilaterally, HKS: no ataxia in BLE.No drift.  Gait- deferred  Most Recent NIH: 0    ASSESSMENT/PLAN  Kaylee Campbell is a 68 y.o. female with history of Hypertension, Hyperlipidemia, T2DM, tobacco use, and asthma. Admitted for Acute CVA.  NIH on Admission 0. Reported symptom of R leg turning and  pain is suspcious of a seizure that occurred in response to a stroke.   Acute Ischemic Infarct:  left frontal corona radiata, and multiple small foci along the interhemispheric fissure.  Etiology:  Multifactorial, Cardioembolic suspected with concurrent small vessel disease as well. Code Stroke CT head: No intracranial pathology.  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 Mild atherosclerosis at the carotid bifurcations with less than 50% stenosis. MRI : Multiple small foci along the interhemispheric fissure at the cingulate gyrus and a punctate lesion within the R midbrain. Suspected subacute infarct.  2D Echo: EF 45-50%, Possible thrombus vs Calcification on chordae at base of mitral valve, Cardiac MRI ordered for further investigation.  LDL 111 HgbA1c Pending VTE prophylaxis - Lovenox  No antithrombotic prior to admission, now on aspirin  81 mg daily and clopidogrel  75 mg daily for 21 weeks and then Aspirin  81mg  daily alone. Therapy recommendations:  Outpatient PT, OT recs pending Disposition:  TBD  Hx of Stroke/TIA MRI shows multiple small foci, no recognized history of strokes.  Seizure - EEG w/ high suspicion for recent seizure  Atrial fibrillation-not a known diagnosis Home Meds: none Continue telemetry monitoring Recommend 30-day heart monitor at discharge, will be mailed to patient  Hypertension Home meds:  none Stable Blood Pressure Goal: SBP less than 160   Hyperlipidemia Home meds:  none LDL 111, goal < 70 Add Atorvastatin  80mg  daily Continue statin at discharge Provided dietary counseling  Diabetes type II Home meds:  none HgbA1c pending, goal < 7.0  Tobacco use disorder - Pt encouraged to quit smoking   Dysphagia Patient has post-stroke dysphagia, SLP  consulted    Diet   Diet Heart Room service appropriate? Yes; Fluid consistency: Thin  Advance diet as tolerated  Other Stroke Risk Factors   Other Active Problems Asthma  Hospital day # 0  D. Penne Mori, DO, PGY1 Neurology Stroke Team  I have personally obtained history,examined this patient, reviewed notes, independently viewed imaging studies, participated in medical decision making and plan of care.ROS completed by me personally and pertinent positives fully documented  I have made any additions or clarifications directly to the above note. Agree with note above.  Patient presented with transient right leg difficulty with tonic inversion and difficulty walking not sure if this represents a tonic seizure versus weakness and MRI scan  does show diffusion hyperintensity in the left cingulate gyrus with corresponding dark signal on the ADC map suggesting embolic stroke.  There is also incidental right pontine lacunar infarct.  Recommend continue ongoing stroke workup.  2D echo suggest thickening at the base of the mitral valve not sure if this represents a clot or calcification.  Will evaluate this further with cardiac MRI. Continue telemetry monitoring.  Continue ongoing stroke workup.  Recommend aspirin and Plavix for 3 weeks followed by aspirin alone and aggressive risk factor modification. Long discussion with patient and husband at the bedside and answered questions.   I personally spent a total of 50 minutes in the care of the patient today including getting/reviewing separately obtained history, performing a medically appropriate exam/evaluation, counseling and educating, placing orders, referring and communicating with other health care professionals, documenting clinical information in the EHR, independently interpreting results, and coordinating care.        Eather Popp, MD Medical Director [facility] Pager: [phone] 11/19/2024 3:55 PM   To contact Stroke  Continuity provider, please refer to [email or web address]. After hours, contact General Neurology

## 2024-11-19 NOTE — ED Notes (Signed)
 Patient ambulated to restroom with steady a gait.

## 2024-11-19 NOTE — Progress Notes (Signed)
   [facility] has been requested to perform a transesophageal echocardiogram on Kaylee Campbell for mobile mass seen on echocardiogram.     The patient does NOT have any absolute or relative contraindications to a Transesophageal Echocardiogram (TEE).  The patient has: No other conditions that may impact this procedure.    After careful review of history and examination, the risks and benefits of transesophageal echocardiogram have been explained including risks of esophageal damage, perforation (1:10,000 risk), bleeding, pharyngeal hematoma as well as other potential complications associated with conscious sedation including aspiration, arrhythmia, respiratory failure and death. Alternatives to treatment were discussed, questions were answered. Patient is willing to proceed.   Kaylee Leontine LOISE Garrick, PA-C  11/19/2024 3:38 PM

## 2024-11-19 NOTE — H&P (Addendum)
 History and Physical    [name] Campbell:[id number] DOB: 16-Mar-1956 DOA: 11/18/2024  DOS: the patient was seen and examined on 11/18/2024  PCP: Kaylee Ade, MD   Patient coming from: Home  I have personally briefly reviewed patient's old medical records in [facility] Link and CareEverywhere  HPI:  Kaylee Campbell is a 68 y.o. year old female with past medical history of hypertension, hyperlipidemia, type 2 diabetes mellitus not on outpatient medications, asthma, and tobacco use disorder.  She presented to [facility] ED with bilateral lower extremity numbness and weakness that began yesterday morning when she woke up around 9 AM.  She noted difficulty walking and coordination challenges due to this however it resolved and she went on about her day.  She notes that the symptoms recurred in her (R)LE only around 7 PM and again resolved.  However the recurrence of symptoms prompted her to present to the ED. She denies vision changes, changes in her speech, upper extremity weakness/numbness, difficulty speaking, Difficulty understanding, confusion, or palpitations.  She endorses a headache which is not new for her.  ED Course: On arrival to [facility] ED patient was noted to be afebrile temp 36.6 C, BP 139/81, HR 87, RR 20, SpO2 100% on room air.  MRI brain obtained and shows multiple small foci of acute/early subacute ischemia along the interhemispheric fissure at the cingulate gyrus and a punctuate lesion within the right midbrain as well as a likely late subacute infarct within the left frontal corona radiata.  CTA head and neck obtained and shows severe stenosis of the left vertebral artery, severe stenosis of the abdominal segment of the left ICA, and mild atherosclerosis of the carotid bifurcations with less than 50% stenosis. Labs notable for creatinine 1.09 and GFR 55 without documented baseline in our system.  Neurology consulted and recommended permissive hypertension x 24 hours and  standard stroke workup.  TRH contacted for admission  Review of Systems:   Review of Systems  Constitutional:  Negative for chills, fever, malaise/fatigue and weight loss.  HENT:  Negative for congestion.   Eyes:  Negative for blurred vision and double vision.  Respiratory:  Negative for cough.   Cardiovascular:  Negative for chest pain and palpitations.  Gastrointestinal:  Negative for abdominal pain, heartburn, nausea and vomiting.  Genitourinary:  Negative for dysuria.  Musculoskeletal:  Negative for myalgias.  Neurological:  Positive for sensory change and headaches. Negative for dizziness, speech change, focal weakness, seizures, loss of consciousness and weakness.       (+) for Gait change  Endo/Heme/Allergies:  Negative for polydipsia.  All other systems reviewed and are negative.  Past Medical History:  Diagnosis Date   Asthma    Hyperlipemia    Hypertension     Past Surgical History:  Procedure Laterality Date   ABDOMINAL HYSTERECTOMY     CESAREAN SECTION     UTERINE FIBROID SURGERY       reports that she has been smoking cigarettes. She has never used smokeless tobacco. She reports current alcohol use. No history on file for drug use.  No Known Allergies  Family History  Problem Relation Age of Onset   Diabetes Father    Hypertension Father    Cancer Mother    Heart failure Mother    Hypertension Mother     Prior to Admission medications   Medication Sig Start Date End Date Taking? Authorizing Provider  cyanocobalamin 1000 MCG tablet Take 1,000 mcg by mouth in  the morning.   Yes [provider]  triamterene-hydrochlorothiazide (MAXZIDE-25) 37.5-25 MG per tablet Take 1 tablet by mouth in the morning.   Yes [provider]    Physical Exam: Vitals:   11/19/24 0100 11/19/24 0312 11/19/24 0415 11/19/24 0556  BP: (!) 139/92 (!) 162/94  135/72  Pulse: 69 79 70 81  Resp: 17 18 16 18   Temp:  98.1 F (36.7 C)    TempSrc:  Oral    SpO2:  96% 100% 97% 96%    Physical Exam Vitals and nursing note reviewed.  HENT:     Head: Normocephalic.  Eyes:     Pupils: Pupils are equal, round, and reactive to light.  Cardiovascular:     Rate and Rhythm: Normal rate and regular rhythm.     Pulses: Normal pulses.     Heart sounds: Normal heart sounds. No murmur heard.    No friction rub. No gallop.  Pulmonary:     Effort: Pulmonary effort is normal. No respiratory distress.     Breath sounds: Normal breath sounds.  Abdominal:     General: There is no distension.     Palpations: Abdomen is soft.     Tenderness: There is no abdominal tenderness. There is no guarding.  Skin:    General: Skin is warm and dry.     Capillary Refill: Capillary refill takes less than 2 seconds.  Neurological:     General: No focal deficit present.     Mental Status: She is alert and oriented to person, place, and time.     Sensory: No sensory deficit.     Comments: Gait not assessed  Psychiatric:        Behavior: Behavior normal.     Labs on Admission: I have personally reviewed following labs and imaging studies  CBC: Recent Labs  Lab 11/18/24 2122  WBC 9.8  HGB 12.0  HCT 38.3  MCV 82.9  PLT 395   Basic Metabolic Panel: Recent Labs  Lab 11/18/24 2122  NA 142  K 3.5  CL 102  CO2 30  GLUCOSE 104*  BUN 21  CREATININE 1.09*  CALCIUM 9.3   GFR: CrCl cannot be calculated (Unknown ideal weight.). Liver Function Tests: Recent Labs  Lab 11/18/24 2122  AST 23  ALT 21  ALKPHOS 64  BILITOT 0.4  PROT 6.8  ALBUMIN 3.9   No results for input(s): LIPASE, AMYLASE in the last 168 hours. No results for input(s): AMMONIA in the last 168 hours. Coagulation Profile: Recent Labs  Lab 11/18/24 2122  INR 1.0     Radiological Exams on Admission: I have personally reviewed images CT ANGIO HEAD NECK W WO CM Result Date: 11/19/2024 EXAM: CTA HEAD AND NECK WITHOUT AND WITH IV CONTRAST 11/19/2024 02:36:38 AM TECHNIQUE: CTA of the  head and neck was performed without and with the administration of 75 mL of iohexol (OMNIPAQUE) 350 mg/mL injection. Multiplanar 2D and/or 3D reformatted images are provided for review. Automated exposure control, iterative reconstruction, and/or weight based adjustment of the mA/kV was utilized to reduce the radiation dose to as low as reasonably achievable. Stenosis of the internal carotid arteries measured using NASCET criteria. COMPARISON: Brain MRI 11/19/2024 CLINICAL HISTORY: Transient ischemic attack (TIA) FINDINGS: CTA NECK: AORTIC ARCH AND ARCH VESSELS: Aortic atherosclerosis. No dissection or arterial injury. No significant stenosis of the brachiocephalic or subclavian arteries. CERVICAL CAROTID ARTERIES: Tortuous course of the right common carotid artery. Mild atherosclerosis at the right carotid bifurcation and proximal  internal carotid artery with less than 50% stenosis. Atherosclerotic calcification at the left carotid bifurcation without hemodynamically significant stenosis. No dissection or arterial injury. 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 Normal right vertebral artery. Mild atherosclerotic calcification of the left vertebral artery V4 segment. No dissection or arterial injury. LUNGS AND MEDIASTINUM: Unremarkable. SOFT TISSUES: No acute abnormality. BONES: No acute abnormality. CTA HEAD: ANTERIOR CIRCULATION: Atherosclerotic calcification of the carotid siphons with mild stenoses of the cavernous segments. 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 No significant stenosis of the anterior cerebral arteries. No significant stenosis of the middle cerebral arteries. No aneurysm. POSTERIOR CIRCULATION: No significant stenosis of the posterior cerebral arteries. No significant stenosis of the basilar artery. No significant stenosis of the vertebral arteries. No aneurysm. OTHER: No dural venous sinus  thrombosis on this non-dedicated study. IMPRESSION: 1. No emergent large vessel occlusion. 2. 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 3. 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 4. Mild atherosclerosis at the carotid bifurcations with less than 50% stenosis. Electronically signed by: Franky Stanford MD 11/19/2024 02:54 AM EST RP Workstation: [id number]   MR BRAIN WO CONTRAST Result Date: 11/19/2024 EXAM: MRI BRAIN WITHOUT CONTRAST 11/19/2024 02:07:30 AM TECHNIQUE: Multiplanar multisequence MRI of the head/brain was performed without the administration of intravenous contrast. COMPARISON: None available. CLINICAL HISTORY: Neuro deficit, acute, stroke suspected. FINDINGS: BRAIN AND VENTRICLES: Multiple small foci of abnormal diffusion restriction are present, including 1 along the interhemispheric fissure at the cingulate gyrus and another punctate lesion within the right midbrain. There is an area of hyperintensity on diffusion-weighted imaging within the left frontal corona radiata with no ADC correlate. 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 No intracranial hemorrhage. No mass. No midline shift. No hydrocephalus. The sella is unremarkable. Normal flow voids. ORBITS: No acute abnormality. SINUSES AND MASTOIDS: No acute abnormality. BONES AND SOFT TISSUES: Normal marrow signal. No acute soft tissue abnormality. IMPRESSION: 1. Multiple small foci of acute/early subacute ischemia, including along the interhemispheric fissure at the cingulate gyrus and a punctate lesion within the right midbrain. 2. 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 Electronically signed by: Franky Stanford MD 11/19/2024 02:21 AM EST RP Workstation: [id number]   CT HEAD WO CONTRAST Result Date:  11/18/2024 CLINICAL DATA:  Intermittent right leg numbness. EXAM: CT HEAD WITHOUT CONTRAST TECHNIQUE: Contiguous axial images were obtained from the base of the skull through the vertex without intravenous contrast. RADIATION DOSE REDUCTION: This exam was performed according to the departmental dose-optimization program which includes automated exposure control, adjustment of the mA and/or kV according to patient size and/or use of iterative reconstruction technique. COMPARISON:  None Available. FINDINGS: Brain: No evidence of acute infarction, hemorrhage, hydrocephalus, extra-axial collection or mass lesion/mass effect. Vascular: No hyperdense vessel or unexpected calcification. Skull: Normal. Negative for fracture or focal lesion. Sinuses/Orbits: No acute finding. Other: None. IMPRESSION: No acute intracranial pathology. Electronically Signed   By: Suzen Dials M.D.   On: 11/18/2024 22:51    EKG: My personal interpretation of EKG shows: Normal sinus rhythm with bifascicular block and left ventricular hypertrophy, HR 65   Assessment/Plan Principal Problem:   Acute ischemic stroke [facility])   ##Acute cerebrovascular accident - Permissive HTN x 24 hrs until 11/20/24 at 0200 - Echo - Aspirin  and Plavix per neuro - Check A1C and LDL - LDL 111--> start atorvastatin 40 mg daily - q4H neuro checks - STAT head CT for any change in neuro exam - Monitor on tele - PT/OT/SLP - Stroke education - Ambulatory referral to neurology upon discharge  ##Severe stenosis of the left vertebral artery  ##Severe stenosis of left ICA ##Carotid Artery Atherosclerosis - Outpatient vascular follow-up  ##Prolonged QT interval - Monitor on telemetry - Daily potassium and magnesium labs - Replete potassium and magnesium PRN with goal K >4 and Mg >2 - Add on magnesium to a.m. labs  #Hypertension -Permissive hypertension as above  #Hyperlipidemia LDL 111 - Start atorvastatin 40 mg daily  #Type 2 diabetes  mellitus Not on outpatient medication - AC/at bedtime SSI and CBG monitoring while inpatient - HGB A1C pending  #Asthma - As needed albuterol  #Tobacco use disorder - Patient counseled on importance of cessation - Nicotine replacement therapy with nicotine patch and gum while inpatient  VTE prophylaxis:  Lovenox  GI prophylaxis: Protonix  Diet: Heart healthy Access: PIV Lines: None Code Status:  Full Code Telemetry: Yes  Admission status: Observation, Telemetry bed Patient is from: Home Anticipated d/c is to: Home Anticipated d/c date is: Tomorrow Patient currently: Undergoing stroke workup  Family Communication: Daughter Tiffany at bedside  Consults called: Neurology consulted by EDP   Severity of Illness: The appropriate patient status for this patient is OBSERVATION. Observation status is judged to be reasonable and necessary in order to provide the required intensity of service to ensure the patient's safety. The patient's presenting symptoms, physical exam findings, and initial radiographic and laboratory data in the context of their medical condition is felt to place them at decreased risk for further clinical deterioration. Furthermore, it is anticipated that the patient will be medically stable for discharge from the hospital within 2 midnights of admission.   To reach the provider On-Call:   7AM- 7PM see care teams to locate the attending and reach out to them via [email or web address] Password: [email or web address] 7PM-7AM contact night-coverage If you still have difficulty reaching the appropriate provider, please page the [facility] (Director on Call) for [facility] on amion for assistance  This document was prepared using Conservation officer, historic buildings and may include unintentional dictation errors.  Rockie Rams FNP-BC, PMHNP-BC Nurse Practitioner [facility] ------------------------------------------------------------------------------------------------------------------------------- Attestation: The patient was seen and examined independently, agree with above H&P. Plan of care was discussed with NP in detail.  Agree with above plan.   SIGNED: Adriana DELENA Grams, MD, FHM. FAAFP [facility],  Pager (please use [email or web address] to page/text)  Please use Epic Secure Chat for non-urgent communication (7AM-7PM) If 7PM-7AM, please contact night-coverage [email or web address],  11/19/2024, 10:03 AM

## 2024-11-19 NOTE — ED Notes (Signed)
 Patient transported to MRI

## 2024-11-19 NOTE — Consult Note (Signed)
 NEUROLOGY CONSULT NOTE   Date of service: November 19, 2024 Patient Name: Kaylee Campbell MRN:  [id number] DOB:  1956-07-25 Chief Complaint: Right leg incoordination and numbness Requesting Provider: Theadore Ozell HERO, MD  History of Present Illness  Kaylee Campbell is a 68 y.o. female with a PMHx of asthma, HTN and HLD who presents to the ED after an episode of RLE incoordination and numbness. She states that she had difficulty walking due to the RLE incoordination. She states that her LLE was also transiently incoordinated at symptom onset, which then resolved, with only the RLE being affected afterwards. She denies any upper extremity symptoms. No facial droop, vision changes, difficulty speaking or confusion. Has a mild right frontal headache, which is typical for her.    ROS  Comprehensive ROS performed and pertinent positives documented in HPI    Past History   Past Medical History:  Diagnosis Date   Asthma    Hyperlipemia    Hypertension     Past Surgical History:  Procedure Laterality Date   ABDOMINAL HYSTERECTOMY     CESAREAN SECTION     UTERINE FIBROID SURGERY      Family History: Family History  Problem Relation Age of Onset   Diabetes Father    Hypertension Father    Cancer Mother    Heart failure Mother    Hypertension Mother     Social History  reports that she has been smoking cigarettes. She has never used smokeless tobacco. She reports current alcohol use. No history on file for drug use.  No Known Allergies  Medications  No current facility-administered medications for this encounter.  Current Outpatient Medications:    glucose blood (ONETOUCH VERIO) test strip, 1 each by Other route daily. And lancets 1/day, Disp: 100 each, Rfl: 12   Multiple Vitamins-Minerals (ONE-A-DAY WOMENS 50+ ADVANTAGE PO), Take 1 tablet by mouth every morning., Disp: , Rfl:    omeprazole (PRILOSEC) 40 MG capsule, Take 40 mg by mouth daily., Disp: , Rfl:    ONETOUCH DELICA  LANCETS 33G MISC, Use to check blood sugar 1 time per day., Disp: 100 each, Rfl: 5   triamterene-hydrochlorothiazide (MAXZIDE-25) 37.5-25 MG per tablet, Take 1 tablet by mouth daily., Disp: , Rfl:   Vitals   Vitals:   11/19/24 0000 11/19/24 0015 11/19/24 0030 11/19/24 0100  BP: (!) 142/98 (!) 155/71 (!) 151/83 (!) 139/92  Pulse: 69   69  Resp: (!) 21 16 13 17   Temp:      TempSrc:      SpO2: 100%   96%    There is no height or weight on file to calculate BMI.   Physical Exam   Constitutional: Appears well-developed and well-nourished.  Psych: Affect appropriate to situation.  Eyes: No scleral injection.  HENT: No OP obstruction.  Head: Normocephalic.  Respiratory: Effort normal, non-labored breathing.  Skin: WDI.   Neurologic Examination   Mental Status: Alert, oriented x 5, thought content appropriate.  Speech fluent without evidence of aphasia.  Able to follow all commands without difficulty. Cranial Nerves: II: Temporal visual fields intact with no extinction to DSS. PERRL  III,IV, VI: No ptosis. EOMI. No nystagmus.  V: Temp sensation equal bilaterally  VII: Smile symmetric VIII: Hearing intact to voice IX,X: No hoarseness XI: Symmetric shoulder shrug XII: Midline tongue extension Motor: BUE 5/5 proximally and distally without asymmetry RLE 4/5 knee and hip flexion, 5/5 knee extension, ADF and APF LLE 5/5 proximally and distally.  Sensory: Temp  and light touch intact throughout, bilaterally. No extinction to DSS.  Deep Tendon Reflexes: 1+ and symmetric bilateral brachioradialis and patellae. Right toe equivocal, left toe downgoing.  Cerebellar: No ataxia with FNF bilaterally. Normal H-S on the left. Subtle/equivocal ataxia with right H-S Gait: Deferred    Labs/Imaging/Neurodiagnostic studies   CBC:  Recent Labs  Lab 11/28/24 2122  WBC 9.8  HGB 12.0  HCT 38.3  MCV 82.9  PLT 395   Basic Metabolic Panel:  Lab Results  Component Value Date   NA 142  2024-11-28   K 3.5 11-28-2024   CO2 30 Nov 28, 2024   GLUCOSE 104 (H) 11-28-2024   BUN 21 November 28, 2024   CREATININE 1.09 (H) 11-28-2024   CALCIUM 9.3 11/28/24   GFRNONAA 55 (L) 11-28-24   Lipid Panel: No results found for: LDLCALC HgbA1c:  Lab Results  Component Value Date   HGBA1C 6.1 (A) 06/20/2018   Urine Drug Screen: No results found for: LABOPIA, COCAINSCRNUR, LABBENZ, AMPHETMU, THCU, LABBARB  Alcohol Level No results found for: [facility] INR  Lab Results  Component Value Date   INR 1.0 2024/11/28     ASSESSMENT  Kaylee Campbell is a 68 y.o. female with a PMHx of asthma, HTN and HLD who presents to the ED after an episode of RLE incoordination and numbness. She states that she had difficulty walking due to the RLE incoordination. She states that her LLE was also transiently incoordinated at symptom onset, which then resolved, with only the RLE being affected afterwards. She denies any upper extremity symptoms. No facial droop, vision changes, difficulty speaking or confusion. Has a mild right frontal headache, which is typical for her.  - Exam reveals mild RLE weakness and equivocal ataxia with right H-S.  - CT head: No acute intracranial pathology.  - CTA of head and neck: No emergent large vessel occlusion. 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 Mild atherosclerosis at the carotid bifurcations with less than 50% stenosis. - MRI brain: Multiple small foci of acute/early subacute ischemia, including along the interhemispheric fissure at the cingulate gyrus and a punctate lesion within the right midbrain. 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 Also seen is a left cerebellar developmental venous  anomaly.  - Impression: Three acute small ischemic infarctions in 2 separate vascular territories. Suspect possible cardioembolic mechanism.   RECOMMENDATIONS  1. HgbA1c, fasting lipid panel 2. TTE 3. PT consult, OT consult, Speech consult 4. Cardiac telemetry 5. Statin 6. DAPT x 21 days, then ASA alone thereafter 7. Risk factor modification 8. Permissive HTN x 24 hours 9. Frequent neuro checks 10. NPO until passes stroke swallow screen  ______________________________________________________________________    Bonney SHARK, Vanderbilt Ranieri, MD [facility]

## 2024-11-20 ENCOUNTER — Encounter (HOSPITAL_COMMUNITY): Admission: EM | Disposition: A | Payer: Self-pay | Source: Home / Self Care | Attending: Emergency Medicine

## 2024-11-20 ENCOUNTER — Observation Stay (HOSPITAL_COMMUNITY): Admitting: Anesthesiology

## 2024-11-20 ENCOUNTER — Other Ambulatory Visit: Payer: Self-pay | Admitting: Cardiology

## 2024-11-20 ENCOUNTER — Other Ambulatory Visit (HOSPITAL_COMMUNITY): Payer: Self-pay

## 2024-11-20 ENCOUNTER — Observation Stay (HOSPITAL_COMMUNITY)

## 2024-11-20 DIAGNOSIS — E1159 Type 2 diabetes mellitus with other circulatory complications: Secondary | ICD-10-CM

## 2024-11-20 DIAGNOSIS — I708 Atherosclerosis of other arteries: Secondary | ICD-10-CM

## 2024-11-20 DIAGNOSIS — I639 Cerebral infarction, unspecified: Secondary | ICD-10-CM | POA: Diagnosis not present

## 2024-11-20 DIAGNOSIS — I10 Essential (primary) hypertension: Secondary | ICD-10-CM

## 2024-11-20 DIAGNOSIS — I679 Cerebrovascular disease, unspecified: Secondary | ICD-10-CM

## 2024-11-20 DIAGNOSIS — E785 Hyperlipidemia, unspecified: Secondary | ICD-10-CM

## 2024-11-20 DIAGNOSIS — I3489 Other nonrheumatic mitral valve disorders: Secondary | ICD-10-CM

## 2024-11-20 DIAGNOSIS — I340 Nonrheumatic mitral (valve) insufficiency: Secondary | ICD-10-CM

## 2024-11-20 DIAGNOSIS — Z720 Tobacco use: Secondary | ICD-10-CM

## 2024-11-20 HISTORY — PX: TRANSESOPHAGEAL ECHOCARDIOGRAM (CATH LAB): EP1270

## 2024-11-20 LAB — HEMOGLOBIN A1C
Hgb A1c MFr Bld: 6.3 % — ABNORMAL HIGH (ref 4.8–5.6)
Mean Plasma Glucose: 134 mg/dL

## 2024-11-20 LAB — CBC
HCT: 35.8 % — ABNORMAL LOW (ref 36.0–46.0)
Hemoglobin: 11.4 g/dL — ABNORMAL LOW (ref 12.0–15.0)
MCH: 26.1 pg (ref 26.0–34.0)
MCHC: 31.8 g/dL (ref 30.0–36.0)
MCV: 81.9 fL (ref 80.0–100.0)
Platelets: 349 K/uL (ref 150–400)
RBC: 4.37 MIL/uL (ref 3.87–5.11)
RDW: 16 % — ABNORMAL HIGH (ref 11.5–15.5)
WBC: 8.1 K/uL (ref 4.0–10.5)
nRBC: 0 % (ref 0.0–0.2)

## 2024-11-20 LAB — GLUCOSE, CAPILLARY
Glucose-Capillary: 109 mg/dL — ABNORMAL HIGH (ref 70–99)
Glucose-Capillary: 165 mg/dL — ABNORMAL HIGH (ref 70–99)
Glucose-Capillary: 105 mg/dL — ABNORMAL HIGH (ref 70–99)

## 2024-11-20 LAB — BASIC METABOLIC PANEL WITH GFR
Anion gap: 11 (ref 5–15)
BUN: 20 mg/dL (ref 8–23)
CO2: 30 mmol/L (ref 22–32)
Calcium: 8.9 mg/dL (ref 8.9–10.3)
Chloride: 101 mmol/L (ref 98–111)
Creatinine, Ser: 1.02 mg/dL — ABNORMAL HIGH (ref 0.44–1.00)
GFR, Estimated: 60 mL/min — ABNORMAL LOW (ref >60)
Glucose, Bld: 100 mg/dL — ABNORMAL HIGH (ref 70–99)
Potassium: 3.9 mmol/L (ref 3.5–5.1)
Sodium: 142 mmol/L (ref 135–145)

## 2024-11-20 LAB — ECHO TEE

## 2024-11-20 SURGERY — TRANSESOPHAGEAL ECHOCARDIOGRAM (TEE) (CATHLAB)
Anesthesia: Monitor Anesthesia Care

## 2024-11-20 MED ORDER — VITAMIN B-12 1000 MCG PO TABS
1000.0000 ug | ORAL_TABLET | Freq: Every day | ORAL | Status: DC
  Administered 2024-11-20 – 2024-11-21 (×2): 1000 ug via ORAL
  Filled 2024-11-20 (×2): qty 1

## 2024-11-20 MED ORDER — PROPOFOL 500 MG/50ML IV EMUL
INTRAVENOUS | Status: DC | PRN
  Administered 2024-11-20: 175 ug/kg/min via INTRAVENOUS

## 2024-11-20 MED ORDER — TRIAMTERENE-HCTZ 37.5-25 MG PO TABS: 1.0000 | ORAL_TABLET | Freq: Every morning | ORAL | Status: DC

## 2024-11-20 MED ORDER — PROPOFOL 10 MG/ML IV BOLUS
INTRAVENOUS | Status: DC | PRN
  Administered 2024-11-20 (×2): 20 mg via INTRAVENOUS
  Administered 2024-11-20: 30 mg via INTRAVENOUS

## 2024-11-20 MED ORDER — LIDOCAINE 2% (20 MG/ML) 5 ML SYRINGE
INTRAMUSCULAR | Status: DC | PRN
  Administered 2024-11-20: 100 mg via INTRAVENOUS

## 2024-11-20 MED ORDER — ONDANSETRON HCL 4 MG/2ML IJ SOLN
INTRAMUSCULAR | Status: DC | PRN
  Administered 2024-11-20: 4 mg via INTRAVENOUS

## 2024-11-20 MED ORDER — NICOTINE 14 MG/24HR TD PT24
14.0000 mg | MEDICATED_PATCH | Freq: Every day | TRANSDERMAL | 0 refills | Status: AC
  Filled 2024-11-20: qty 28, 28d supply, fill #0

## 2024-11-20 MED ORDER — ATORVASTATIN CALCIUM 80 MG PO TABS
80.0000 mg | ORAL_TABLET | Freq: Every day | ORAL | 1 refills | Status: AC
  Filled 2024-11-20: qty 30, 30d supply, fill #0

## 2024-11-20 MED ORDER — SODIUM CHLORIDE 0.9 % IV SOLN: INTRAVENOUS | Status: DC

## 2024-11-20 MED ORDER — CLOPIDOGREL BISULFATE 75 MG PO TABS
75.0000 mg | ORAL_TABLET | Freq: Every day | ORAL | 0 refills | Status: DC
  Filled 2024-11-20: qty 20, 20d supply, fill #0

## 2024-11-20 MED ORDER — ASPIRIN 81 MG PO TBEC
81.0000 mg | DELAYED_RELEASE_TABLET | Freq: Every day | ORAL | 12 refills | Status: DC
  Filled 2024-11-20: qty 30, 30d supply, fill #0

## 2024-11-20 NOTE — TOC Transition Note (Signed)
 Transition of Care [facility]) - Discharge Note   Patient Details  Name: Kaylee Campbell MRN: [id number] Date of Birth: [birth year]  [facility] CM/SW Contact:  Landry DELENA Senters, RN Phone Number: 11/20/2024, 4:06 PM   Clinical Narrative:       Final next level of care: OP Rehab Barriers to Discharge: No Barriers Identified   Patient Goals and CMS Choice   CMS Medicare.gov Compare Post Acute Care list provided to:: Patient Choice offered to / list presented to : Patient      Discharge Placement                       Discharge Plan and Services Additional resources added to the After Visit Summary for                                       Social Drivers of Health (SDOH) Interventions SDOH Screenings   Food Insecurity: No Food Insecurity (11/19/2024)  Housing: Low Risk  (11/19/2024)  Transportation Needs: No Transportation Needs (11/19/2024)  Utilities: Not At Risk (11/19/2024)  Social Connections: Moderately Integrated (11/19/2024)  Tobacco Use: High Risk (11/18/2024)     Readmission Risk Interventions     No data to display

## 2024-11-20 NOTE — Anesthesia Preprocedure Evaluation (Addendum)
 Anesthesia Evaluation  Patient identified by MRN, date of birth, ID band Patient awake    Reviewed: Allergy & Precautions, H&P , NPO status , Patient's Chart, lab work & pertinent test results  Airway Mallampati: II  TM Distance: >3 FB Neck ROM: Full    Dental  (+) Edentulous Upper, Edentulous Lower   Pulmonary asthma , Current Smoker   Pulmonary exam normal breath sounds clear to auscultation       Cardiovascular hypertension, Normal cardiovascular exam Rhythm:Regular Rate:Normal  Severe stenosis of the left vertebral artery, Severe stenosis of left ICA, Carotid Artery Atherosclerosis  TTE: IMPRESSIONS     1. Left ventricular ejection fraction, by estimation, is 45 to 50%. The  left ventricle has mildly decreased function. The left ventricle has no  regional wall motion abnormalities. Left ventricular diastolic parameters  are consistent with Grade I  diastolic dysfunction (impaired relaxation).   2. Right ventricular systolic function is normal. The right ventricular  size is normal. Tricuspid regurgitation signal is inadequate for assessing  PA pressure.   3. Mobile mass on the chordae suspicious for thrombus vs calcification.  May benefit from Cardiac MR in the settting of recent CVA. SABRA The mitral  valve is normal in structure. Mild mitral valve regurgitation. No evidence  of mitral stenosis.   4. The aortic valve is normal in structure. Aortic valve regurgitation is  not visualized. No aortic stenosis is present.   5. The inferior vena cava is normal in size with greater than 50%  respiratory variability, suggesting right atrial pressure of 3 mmHg.     Neuro/Psych CVA  negative psych ROS   GI/Hepatic negative GI ROS, Neg liver ROS,,,  Endo/Other  diabetes, Type 2    Renal/GU negative Renal ROS  negative genitourinary   Musculoskeletal negative musculoskeletal ROS (+)    Abdominal   Peds negative pediatric  ROS (+)  Hematology negative hematology ROS (+)   Anesthesia Other Findings   Reproductive/Obstetrics negative OB ROS                              Anesthesia Physical Anesthesia Plan  ASA: 3  Anesthesia Plan: MAC   Post-op Pain Management:    Induction: Intravenous  PONV Risk Score and Plan: 1 and Propofol infusion and Treatment may vary due to age or medical condition  Airway Management Planned: Natural Airway  Additional Equipment:   Intra-op Plan:   Post-operative Plan:   Informed Consent: I have reviewed the patients History and Physical, chart, labs and discussed the procedure including the risks, benefits and alternatives for the proposed anesthesia with the patient or authorized representative who has indicated his/her understanding and acceptance.     Dental advisory given  Plan Discussed with: CRNA  Anesthesia Plan Comments:          Anesthesia Quick Evaluation

## 2024-11-20 NOTE — Progress Notes (Addendum)
 STROKE TEAM PROGRESS NOTE    SIGNIFICANT HOSPITAL EVENTS 12/2 - Admitted  INTERIM HISTORY/SUBJECTIVE Pt sitting up comfortably in bed.  Cardiac MRI last night was not able to adequately visualize well the mitral valve mass as it was highly mobile and plan is to get TEE today to further characterize it.  Neurological exam is stable without any recurrent symptoms. EEG done results pending OBJECTIVE  CBC    Component Value Date/Time   WBC 8.1 11/20/2024 0423   RBC 4.37 11/20/2024 0423   HGB 11.4 (L) 11/20/2024 0423   HCT 35.8 (L) 11/20/2024 0423   PLT 349 11/20/2024 0423   MCV 81.9 11/20/2024 0423   MCH 26.1 11/20/2024 0423   MCHC 31.8 11/20/2024 0423   RDW 16.0 (H) 11/20/2024 0423    BMET    Component Value Date/Time   NA 142 11/20/2024 0423   K 3.9 11/20/2024 0423   CL 101 11/20/2024 0423   CO2 30 11/20/2024 0423   GLUCOSE 100 (H) 11/20/2024 0423   BUN 20 11/20/2024 0423   CREATININE 1.02 (H) 11/20/2024 0423   CALCIUM 8.9 11/20/2024 0423   GFRNONAA 60 (L) 11/20/2024 0423    IMAGING past 24 hours EP STUDY Result Date: 11/20/2024 See surgical note for result.  MR CARDIAC MORPHOLOGY W WO CONTRAST Result Date: 11/19/2024 CLINICAL DATA:  Mitral valve mass COMPARISON: Echo 11/19/24 EXAM: MR CARDIA MORPHOLOGY WITHOUT AND WITH CONTRAST; MR CARDIAC VELOCITY FLOW MAPPING TECHNIQUE: The patient was scanned on a 1.5 Tesla Siemens magnet. A dedicated cardiac coil was used. Functional imaging was done using TrueFisp sequences. 2,3, and 4 chamber views were done to assess for RWMA's. Modified Simpson's rule using a short axis stack was used to calculate an ejection fraction on a dedicated work [employer]. The patient received 10mL GADAVIST GADOBUTROL 1 MMOL/ML IV SOLN. After 10 minutes inversion recovery sequences were used to assess for infiltration and scar tissue. Phase contrast velocity encoded images obtained x 2. This examination is tailored for evaluation cardiac  anatomy and function and provides very limited assessment of noncardiac structures, which are accordingly not evaluated during interpretation. If there is clinical concern for extracardiac pathology, further evaluation with CT imaging should be considered. FINDINGS: LEFT VENTRICLE: Left ventricular chamber size: Mildly dilated by indexed volumes. Left ventricular wall thickness: Normal. Left ventricular systolic function: Mildly decreased LVEF = 41%, by biplane volumes 45%. Prominent apical trabeculations without definite evidence of noncompaction cardiomyopathy. Global hypokinesis with subtle mid-apical lateral wall moderate hypokinesis. No myocardial edema, T2 = 48 msec Normal first pass perfusion. There is post contrast delayed myocardial enhancement: Small focus of mid-apical lateral wall LGE, likely subendocardial-midmyocardial, most suggestive of a prior small infarct, possibly embolic. Alternatively, may represent small focus of prior myocarditis. Normal T1 myocardial nulling kinetics suggest against a diagnosis of cardiac amyloidosis. ECV = 30% RIGHT VENTRICLE: Normal right ventricular chamber size. Normal right ventricular wall thickness. Mildly reduced right ventricular systolic function. RVEF = 49% There are no regional wall motion abnormalities. No post contrast delayed myocardial enhancement. ATRIA: Normal biatrial size with redundant atrial septum. PERICARDIUM: Normal pericardium.  Trivial pericardial effusion. OTHER: No significant extracardiac findings. MEASUREMENTS: Qp/Qs: 1.01 VALVES: Aortic valve regurgitation: Mild, regurgitant fraction 6% Pulmonary valve regurgitation: Trivial, regurgitant fraction 1% Mitral valve regurgitation: Moderate, regurgitant fraction 30% Tricuspid valve regurgitation: Mild-moderate, regurgitant fraction 20% Left ventricle: LV female LV EF: 41 % (Normal 52-79%) Absolute volumes: LV EDV: (Normal 78-167 mL) LV ESV: (Normal 21-64 mL) LV  SV: 70mL (Normal 52-114  mL) CO: 5.2L/min (Normal 2.7-6.3 L/min) Indexed volumes: CI: 3.0L/min/sq-m (Normal 1.9-3.9 L/min/sq-m) LV EDV: 47mL/sq-m (Normal 50-96 mL/sq-m) LV ESV: 41mL/sq-m (Normal 10-40 mL/sq-m) LV SV: 31mL/sq-m (Normal 33-64 mL/sq-m) Right ventricle: RV female RV EF: 49% (normal 52-80%) Absolute volumes: RV EDV: (Normal 79-175 mL) RV ESV: 62mL (Normal 13-75 mL) RV SV: 59mL (Normal 56-110 mL) CO: 4.4L/min (Normal 2.7-6 L/min) Indexed volumes: CI: 2.5L/min/sq-m (Normal 1.8-3.8 L/min/sq-m) RV EDV: 64mL/sq-m (Normal 51-97 mL/sq-m) RV ESV: 44mL/sq-m (Normal 9-42 mL/sq-m) RV SV: 13mL/sq-m (Normal 35-61 mL/sq-m) IMPRESSION: 1. 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 Unable to characterize. Consider TEE. 2. Mildly reduced LV systolic function with mild LV dilation by indexed volume. LVEF 41%, visually 45%, biplane volume calculated 45%. There is a small focus of mid-apical inferolateral wall delayed enhancement, likely subendocardial-midmyocardial, most suggestive of a prior small infarct, possibly embolic. Alternatively, may represent small focus of prior myocarditis. There is an associated mid-apical lateral wall regional wall motion abnormality. 3. Normal right ventricular chamber size with mildly reduced RV systolic function, RVEF 49%. 4. Moderate mitral valve regurgitation, mild-moderate tricuspid valve regurgitation. Electronically Signed   By: Soyla Merck M.D.   On: 11/19/2024 22:35   MR CARDIAC VELOCITY FLOW MAP Result Date: 11/19/2024 CLINICAL DATA:  Mitral valve mass COMPARISON: Echo 11/19/24 EXAM: MR CARDIA MORPHOLOGY WITHOUT AND WITH CONTRAST; MR CARDIAC VELOCITY FLOW MAPPING TECHNIQUE: The patient was scanned on a 1.5 Tesla Siemens magnet. A dedicated cardiac coil was used. Functional imaging was done using TrueFisp sequences. 2,3, and 4 chamber views were done to assess for RWMA's. Modified Simpson's rule using a short  axis stack was used to calculate an ejection fraction on a dedicated work [employer]. The patient received 10mL GADAVIST GADOBUTROL 1 MMOL/ML IV SOLN. After 10 minutes inversion recovery sequences were used to assess for infiltration and scar tissue. Phase contrast velocity encoded images obtained x 2. This examination is tailored for evaluation cardiac anatomy and function and provides very limited assessment of noncardiac structures, which are accordingly not evaluated during interpretation. If there is clinical concern for extracardiac pathology, further evaluation with CT imaging should be considered. FINDINGS: LEFT VENTRICLE: Left ventricular chamber size: Mildly dilated by indexed volumes. Left ventricular wall thickness: Normal. Left ventricular systolic function: Mildly decreased LVEF = 41%, by biplane volumes 45%. Prominent apical trabeculations without definite evidence of noncompaction cardiomyopathy. Global hypokinesis with subtle mid-apical lateral wall moderate hypokinesis. No myocardial edema, T2 = 48 msec Normal first pass perfusion. There is post contrast delayed myocardial enhancement: Small focus of mid-apical lateral wall LGE, likely subendocardial-midmyocardial, most suggestive of a prior small infarct, possibly embolic. Alternatively, may represent small focus of prior myocarditis. Normal T1 myocardial nulling kinetics suggest against a diagnosis of cardiac amyloidosis. ECV = 30% RIGHT VENTRICLE: Normal right ventricular chamber size. Normal right ventricular wall thickness. Mildly reduced right ventricular systolic function. RVEF = 49% There are no regional wall motion abnormalities. No post contrast delayed myocardial enhancement. ATRIA: Normal biatrial size with redundant atrial septum. PERICARDIUM: Normal pericardium.  Trivial pericardial effusion. OTHER: No significant extracardiac findings. MEASUREMENTS: Qp/Qs: 1.01 VALVES: Aortic valve regurgitation: Mild, regurgitant  fraction 6% Pulmonary valve regurgitation: Trivial, regurgitant fraction 1% Mitral valve regurgitation: Moderate, regurgitant fraction 30% Tricuspid valve regurgitation: Mild-moderate, regurgitant fraction 20% Left ventricle: LV female LV EF: 41 % (Normal 52-79%) Absolute volumes: LV EDV: (Normal 78-167  mL) LV ESV: (Normal 21-64 mL) LV SV: 70mL (Normal 52-114 mL) CO: 5.2L/min (Normal 2.7-6.3 L/min) Indexed volumes: CI: 3.0L/min/sq-m (Normal 1.9-3.9 L/min/sq-m) LV EDV: 10mL/sq-m (Normal 50-96 mL/sq-m) LV ESV: 39mL/sq-m (Normal 10-40 mL/sq-m) LV SV: 10mL/sq-m (Normal 33-64 mL/sq-m) Right ventricle: RV female RV EF: 49% (normal 52-80%) Absolute volumes: RV EDV: (Normal 79-175 mL) RV ESV: 62mL (Normal 13-75 mL) RV SV: 59mL (Normal 56-110 mL) CO: 4.4L/min (Normal 2.7-6 L/min) Indexed volumes: CI: 2.5L/min/sq-m (Normal 1.8-3.8 L/min/sq-m) RV EDV: 48mL/sq-m (Normal 51-97 mL/sq-m) RV ESV: 31mL/sq-m (Normal 9-42 mL/sq-m) RV SV: 49mL/sq-m (Normal 35-61 mL/sq-m) IMPRESSION: 1. 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 Unable to characterize. Consider TEE. 2. Mildly reduced LV systolic function with mild LV dilation by indexed volume. LVEF 41%, visually 45%, biplane volume calculated 45%. There is a small focus of mid-apical inferolateral wall delayed enhancement, likely subendocardial-midmyocardial, most suggestive of a prior small infarct, possibly embolic. Alternatively, may represent small focus of prior myocarditis. There is an associated mid-apical lateral wall regional wall motion abnormality. 3. Normal right ventricular chamber size with mildly reduced RV systolic function, RVEF 49%. 4. Moderate mitral valve regurgitation, mild-moderate tricuspid valve regurgitation. Electronically Signed   By: Soyla Merck M.D.   On: 11/19/2024 22:35   MR CARDIAC VELOCITY FLOW MAP Result Date: 11/19/2024 CLINICAL DATA:  Mitral  valve mass COMPARISON: Echo 11/19/24 EXAM: MR CARDIA MORPHOLOGY WITHOUT AND WITH CONTRAST; MR CARDIAC VELOCITY FLOW MAPPING TECHNIQUE: The patient was scanned on a 1.5 Tesla Siemens magnet. A dedicated cardiac coil was used. Functional imaging was done using TrueFisp sequences. 2,3, and 4 chamber views were done to assess for RWMA's. Modified Simpson's rule using a short axis stack was used to calculate an ejection fraction on a dedicated work [employer]. The patient received 10mL GADAVIST GADOBUTROL 1 MMOL/ML IV SOLN. After 10 minutes inversion recovery sequences were used to assess for infiltration and scar tissue. Phase contrast velocity encoded images obtained x 2. This examination is tailored for evaluation cardiac anatomy and function and provides very limited assessment of noncardiac structures, which are accordingly not evaluated during interpretation. If there is clinical concern for extracardiac pathology, further evaluation with CT imaging should be considered. FINDINGS: LEFT VENTRICLE: Left ventricular chamber size: Mildly dilated by indexed volumes. Left ventricular wall thickness: Normal. Left ventricular systolic function: Mildly decreased LVEF = 41%, by biplane volumes 45%. Prominent apical trabeculations without definite evidence of noncompaction cardiomyopathy. Global hypokinesis with subtle mid-apical lateral wall moderate hypokinesis. No myocardial edema, T2 = 48 msec Normal first pass perfusion. There is post contrast delayed myocardial enhancement: Small focus of mid-apical lateral wall LGE, likely subendocardial-midmyocardial, most suggestive of a prior small infarct, possibly embolic. Alternatively, may represent small focus of prior myocarditis. Normal T1 myocardial nulling kinetics suggest against a diagnosis of cardiac amyloidosis. ECV = 30% RIGHT VENTRICLE: Normal right ventricular chamber size. Normal right ventricular wall thickness. Mildly reduced right ventricular  systolic function. RVEF = 49% There are no regional wall motion abnormalities. No post contrast delayed myocardial enhancement. ATRIA: Normal biatrial size with redundant atrial septum. PERICARDIUM: Normal pericardium.  Trivial pericardial effusion. OTHER: No significant extracardiac findings. MEASUREMENTS: Qp/Qs: 1.01 VALVES: Aortic valve regurgitation: Mild, regurgitant fraction 6% Pulmonary valve regurgitation: Trivial, regurgitant fraction 1% Mitral valve regurgitation: Moderate, regurgitant fraction 30% Tricuspid valve regurgitation: Mild-moderate, regurgitant fraction 20% Left ventricle: LV female LV EF: 41 % (Normal 52-79%)  Absolute volumes: LV EDV: (Normal 78-167 mL) LV ESV: (Normal 21-64 mL) LV SV: 70mL (Normal 52-114 mL) CO: 5.2L/min (Normal 2.7-6.3 L/min) Indexed volumes: CI: 3.0L/min/sq-m (Normal 1.9-3.9 L/min/sq-m) LV EDV: 56mL/sq-m (Normal 50-96 mL/sq-m) LV ESV: 68mL/sq-m (Normal 10-40 mL/sq-m) LV SV: 68mL/sq-m (Normal 33-64 mL/sq-m) Right ventricle: RV female RV EF: 49% (normal 52-80%) Absolute volumes: RV EDV: (Normal 79-175 mL) RV ESV: 62mL (Normal 13-75 mL) RV SV: 59mL (Normal 56-110 mL) CO: 4.4L/min (Normal 2.7-6 L/min) Indexed volumes: CI: 2.5L/min/sq-m (Normal 1.8-3.8 L/min/sq-m) RV EDV: 53mL/sq-m (Normal 51-97 mL/sq-m) RV ESV: 106mL/sq-m (Normal 9-42 mL/sq-m) RV SV: 83mL/sq-m (Normal 35-61 mL/sq-m) IMPRESSION: 1. 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 Unable to characterize. Consider TEE. 2. Mildly reduced LV systolic function with mild LV dilation by indexed volume. LVEF 41%, visually 45%, biplane volume calculated 45%. There is a small focus of mid-apical inferolateral wall delayed enhancement, likely subendocardial-midmyocardial, most suggestive of a prior small infarct, possibly embolic. Alternatively, may represent small focus of prior myocarditis. There is an associated mid-apical  lateral wall regional wall motion abnormality. 3. Normal right ventricular chamber size with mildly reduced RV systolic function, RVEF 49%. 4. Moderate mitral valve regurgitation, mild-moderate tricuspid valve regurgitation. Electronically Signed   By: Soyla Merck M.D.   On: 11/19/2024 22:35    Vitals:   11/20/24 1305 11/20/24 1310 11/20/24 1315 11/20/24 1320  BP: 122/69 120/74 128/74 (!) 143/73  Pulse: 86 84 74 69  Resp: (!) 24 (!) 22 19 14   Temp:      TempSrc:      SpO2: 97% 96% 97% 97%    PHYSICAL EXAM General:  Alert, well-nourished, well-developed patient in no acute distress Psych:  Mood and affect appropriate for situation CV: Regular rate and rhythm on monitor Respiratory:  Regular, unlabored respirations on room air GI: Abdomen soft and nontender   NEURO:  Mental Status: AA&Ox3, patient is able to give clear and coherent history Speech/Language: speech is without dysarthria or aphasia.  Naming, repetition, fluency, and comprehension intact.  Cranial Nerves:  II: PERRL. Visual fields full.  III, IV, VI: EOMI. Eyelids elevate symmetrically.  V: Sensation is intact to light touch and symmetrical to face.  VII: Face is symmetrical resting and smiling VIII: hearing intact to voice. IX, X: Palate elevates symmetrically. Phonation is normal.  KP:Dynloizm shrug 5/5. XII: tongue is midline without fasciculations. Motor: 5/5 strength to all muscle groups tested.  Tone: is normal and bulk is normal Sensation- Intact to light touch bilaterally. Extinction absent to light touch to DSS.   Coordination: FTN intact bilaterally, HKS: no ataxia in BLE.No drift.  Gait- deferred  Most Recent NIH: 0    ASSESSMENT/PLAN  Ms. DEMICA ZOOK is a 68 y.o. female with history of Hypertension, Hyperlipidemia, T2DM, tobacco use, and asthma. Admitted for Acute CVA.  NIH on Admission 0. Reported symptom of R leg turning and pain is suspcious of a seizure that occurred in response to a  stroke.   Acute Ischemic Infarct:  left frontal corona radiata, and multiple small foci along the interhemispheric fissure.  Etiology:  Multifactorial, Cardioembolic suspected with concurrent small vessel disease as well. Code Stroke CT head: No intracranial pathology.  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 Severe stenosis of the ophthalmic segment of the left ICA with mild stenoses of the cavernous segments  due to atherosclerotic calcification. Mild atherosclerosis at the carotid bifurcations with less than 50% stenosis. MRI : Multiple small foci along the interhemispheric fissure at the cingulate gyrus and a punctate lesion within the R midbrain. Suspected subacute infarct.  2D Echo: EF 45-50%, Possible thrombus vs Calcification on chordae at base of mitral valve, Cardiac MRI ordered for further investigation.  LDL 111 HgbA1c Pending VTE prophylaxis - Lovenox No antithrombotic prior to admission, now on aspirin 81 mg daily and clopidogrel 75 mg daily for 21 weeks and then Aspirin 81mg  daily alone. Therapy recommendations:  Outpatient PT, OT recs pending Disposition:  TBD  Hx of Stroke/TIA MRI shows multiple small foci, no recognized history of strokes.  Seizure - EEG w/ high suspicion for recent seizure  Atrial fibrillation-not a known diagnosis Home Meds: none Continue telemetry monitoring Recommend 30-day heart monitor at discharge, will be mailed to patient  Hypertension Home meds:  none Stable Blood Pressure Goal: SBP less than 160   Hyperlipidemia Home meds:  none LDL 111, goal < 70 Add Atorvastatin 80mg  daily Continue statin at discharge Provided dietary counseling  Diabetes type II Home meds:  none HgbA1c pending, goal < 7.0  Tobacco use disorder - Pt encouraged to quit smoking   Dysphagia Patient has post-stroke dysphagia, SLP consulted    Diet   Diet NPO time specified Except for: Sips  with Meds  Advance diet as tolerated  Other Stroke Risk Factors   Other Active Problems Asthma  Hospital day # 0    Patient presented with transient right leg difficulty with tonic inversion and difficulty walking not sure if this represents a tonic seizure versus weakness and MRI scan does show diffusion hyperintensity in the left cingulate gyrus with corresponding dark signal on the ADC map suggesting embolic stroke.  There is also incidental right pontine lacunar infarct.  Recommend continue ongoing stroke workup and check TEE today as 2D echo suggest thickening at the base of the mitral valve not sure if this represents a clot or calcification.  Cardiac MRI was inconclusive as mitral valve mass could not be adequately visualized due to severe hypermobility. Continue telemetry monitoring.  Continue ongoing stroke workup.  Recommend aspirin and Plavix for 3 weeks followed by aspirin alone and aggressive risk factor modification.  Await EEG results Long discussion with patient and husband at the bedside and answered questions.   I personally spent a total of 35 minutes in the care of the patient today including getting/reviewing separately obtained history, performing a medically appropriate exam/evaluation, counseling and educating, placing orders, referring and communicating with other health care professionals, documenting clinical information in the EHR, independently interpreting results, and coordinating care.        Eather Popp, MD Medical Director [facility] Pager: [phone] 11/20/2024 1:38 PM   To contact Stroke Continuity provider, please refer to [email or web address]. After hours, contact General Neurology

## 2024-11-20 NOTE — Progress Notes (Signed)
 SLP Progress Note:  SLP attempted to see patient for skilled cognitive-linguistic evaluation, however patient out of room/unavailable due to TEE procedure. Will attempt at next available opportunity.

## 2024-11-20 NOTE — Evaluation (Signed)
 Occupational Therapy Evaluation/Discharge Patient Details Name: Kaylee Campbell MRN: [id number] DOB: [birth year] Today's Date: 11/20/2024   History of Present Illness   68 yo female presents 12/2 with intermittent numbness R LE, NIH 0. MRI showed multiple small foci of acute/early subacute ischemia in R midrbrain and remote subacute infarct left frontal corona radiata. PMH includes: HLD, HTN, and asthma.     Clinical Impressions Patient evaluated by Occupational Therapy with no further acute OT needs identified. Pt is able to complete all ADL tasks independently and does not verbalize any acute OT needs. All education has been completed and the patient has no further questions. No follow-up Occupational Therapy or equipment needs. OT is signing off. Thank you for this referral.       Functional Status Assessment   Patient has not had a recent decline in their functional status     Equipment Recommendations   None recommended by OT      Precautions/Restrictions   Precautions Precautions: Fall Recall of Precautions/Restrictions: Impaired Restrictions Weight Bearing Restrictions Per Provider Order: No     Mobility Bed Mobility Overal bed mobility: Independent       Transfers Overall transfer level: Needs assistance Equipment used: None Transfers: Sit to/from Stand Sit to Stand: Supervision           General transfer comment: supervision due to poor awareness of lines, pt able to stand without instability      Balance Overall balance assessment: Needs assistance Sitting-balance support: No upper extremity supported, Feet supported Sitting balance-Leahy Scale: Good     Standing balance support: No upper extremity supported, During functional activity Standing balance-Leahy Scale: Good            ADL either performed or assessed with clinical judgement   ADL Overall ADL's : Independent;At baseline          Vision Baseline Vision/History: 1 Wears  glasses Ability to See in Adequate Light: 0 Adequate Patient Visual Report: No change from baseline Vision Assessment?: No apparent visual deficits     Perception Perception: Within Functional Limits       Praxis Praxis: WFL       Pertinent Vitals/Pain Pain Assessment Pain Assessment: No/denies pain     Extremity/Trunk Assessment Upper Extremity Assessment Upper Extremity Assessment: Right hand dominant;Overall [facility] for tasks assessed RUE Deficits / Details: Mild fine motor deficits although not impeding in functional tasks. Able to use bilateral hands for all tasks during eval with no difficulty. RUE Sensation: WNL   Lower Extremity Assessment Lower Extremity Assessment: Defer to PT evaluation   Cervical / Trunk Assessment Cervical / Trunk Assessment: Normal   Communication Communication Communication: No apparent difficulties   Cognition Arousal: Alert Behavior During Therapy: WFL for tasks assessed/performed Cognition: No apparent impairments    OT - Cognition Comments: followed all commands, oriented x4. No concerns were noted during evaluation.        Following commands: Intact       Cueing  General Comments   Cueing Techniques: Verbal cues  VSS on RA           Home Living Family/patient expects to be discharged to:: Private residence Living Arrangements: Spouse/significant other;Other (Comment) (grandson) Available Help at Discharge: Family;Available 24 hours/day Type of Home: House Home Access: Level entry     Home Layout: One level     Bathroom Shower/Tub: Chief Strategy Officer: Standard     Home Equipment: None  Prior Functioning/Environment Prior Level of Function : Driving    ADLs Comments: recently quit her part-time job.    OT Problem List: Decreased coordination        OT Goals(Current goals can be found in the care plan section)   Acute Rehab OT Goals Patient Stated Goal: to wash up OT Goal  Formulation: All assessment and education complete, DC therapy   OT Frequency:   1X visit       AM-PAC OT 6 Clicks Daily Activity     Outcome Measure Help from another person eating meals?: None Help from another person taking care of personal grooming?: None Help from another person toileting, which includes using toliet, bedpan, or urinal?: None Help from another person bathing (including washing, rinsing, drying)?: None Help from another person to put on and taking off regular upper body clothing?: None Help from another person to put on and taking off regular lower body clothing?: None 6 Click Score: 24   End of Session Nurse Communication: Mobility status  Activity Tolerance: Patient tolerated treatment well Patient left: in bed;with call bell/phone within reach;with family/visitor present  OT Visit Diagnosis: Muscle weakness (generalized) (M62.81)                Time: 8988-8971 OT Time Calculation (min): 17 min Charges:  OT General Charges $OT Visit: 1 Visit OT Evaluation $OT Eval Low Complexity: 1 Low  [name], OTR/L,CBIS  Supplemental OT - [facility] and [facility] Secure Chat Preferred    Alyona Romack, Leita BIRCH 11/20/2024, 11:45 AM

## 2024-11-20 NOTE — CV Procedure (Signed)
    TRANSESOPHAGEAL ECHOCARDIOGRAM   NAME:  Kaylee Campbell    MRN: [id number] DOB:  07/24/1956    ADMIT DATE: 11/18/2024  INDICATIONS: CVA  PROCEDURE:   Informed consent was obtained prior to the procedure. The risks, benefits and alternatives for the procedure were discussed and the patient comprehended these risks.  Risks include, but are not limited to, cough, sore throat, vomiting, nausea, somnolence, esophageal and stomach trauma or perforation, bleeding, low blood pressure, aspiration, pneumonia, infection, trauma to the teeth and death.    Procedural time out performed. The oropharynx was anesthetized with viscous lidocaine.  Anesthesia was administered by the anaesthesilogy team to achieve and maintain moderate to deep conscious sedation.  The patient's heart rate, blood pressure, and oxygen saturation were monitored continuously during the procedure.  The transesophageal probe was inserted in the esophagus and stomach without difficulty and multiple views were obtained.   The patient tolerated the procedure well.  COMPLICATIONS:    There were no immediate complications.  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  Full report to follow. Further management per primary team.   Ezra Denne, DO [facility]  [facility]  1:46 PM

## 2024-11-20 NOTE — Anesthesia Postprocedure Evaluation (Signed)
 Anesthesia Post Note  Patient: Kaylee Campbell  Procedure(s) Performed: TRANSESOPHAGEAL ECHOCARDIOGRAM     Patient location during evaluation: PACU Anesthesia Type: MAC Level of consciousness: awake and alert Pain management: pain level controlled Vital Signs Assessment: post-procedure vital signs reviewed and stable Respiratory status: spontaneous breathing, nonlabored ventilation, respiratory function stable and patient connected to nasal cannula oxygen Cardiovascular status: stable and blood pressure returned to baseline Postop Assessment: no apparent nausea or vomiting Anesthetic complications: no   No notable events documented.  Last Vitals:  Vitals:   11/20/24 1300 11/20/24 1305  BP: 121/66 122/69  Pulse: 89 86  Resp: (!) 25 (!) 24  Temp:    SpO2: 96% 97%    Last Pain:  Vitals:   11/20/24 1300  TempSrc:   PainSc: 0-No pain                 Thom JONELLE Peoples

## 2024-11-20 NOTE — H&P (View-Only) (Signed)
 PROGRESS NOTE     Patient Demographics:    Kaylee Campbell, is a 68 y.o. female, DOB - [birth year], FMW:[id number]  Outpatient Primary MD for the patient is Gorge Ade, MD    LOS - 0  Admit date - 11/18/2024    Chief Complaint  Patient presents with   Extremity Weakness       Brief Narrative (HPI from H&P)    68 y.o. year old female with past medical history of hypertension, hyperlipidemia, type 2 diabetes mellitus not on outpatient medications, asthma, and tobacco use disorder.  She presented to [facility] ED with bilateral lower extremity numbness and weakness that began yesterday morning when she woke up around 9 AM.  She noted difficulty walking and coordination challenges due to this however it resolved and she went on about her day.  She notes that the symptoms recurred in her (R)LE only around 7 PM and again resolved.  However the recurrence of symptoms prompted her to present to the ED. the ER workup consistent with stroke and admitted to the hospital   Subjective:    [facility] today has, No headache, No chest pain, No abdominal pain - No Nausea, No new weakness tingling or numbness, no shortness of breath   Assessment  & Plan :   Acute CVA with MRI positive for -  Multiple small foci of acute/early subacute ischemia, including along the interhemispheric fissure at the cingulate gyrus and a punctate lesion within the right midbrain. 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  Neuro team following, full stroke workup being done, currently on DAPT along with statin, for now plan is for DAPT for 3 weeks thereafter aspirin alone, was  found no antiplatelets or statin at home, A1c suggest mild DM type II for which diet control and PCP monitoring will be suggested, LDL was above goal, echocardiogram suggestive of possible mass on mitral valve, cardiac MRI could not confirm it, now due for TEE on 11/20/2024.  Continue to monitor  ##Severe stenosis of the left vertebral artery, Severe stenosis  of left ICA, Carotid Artery Atherosclerosis  - Outpatient vascular follow-up, DAPT and statin as above.  Hypertension.  Permissive hypertension for #1 above  Dyslipidemia.  Placed on statin.  History of asthma.  Stable.    Ongoing history of smoking.  Counseled, NicoDerm patch.      Condition - Fair  Family Communication  : Nephew bedside 11/20/2024  Code Status :  Full  Consults  :  Neuro, Cards  PUD Prophylaxis :    Procedures  :            Disposition Plan  :    Status is: Observation  DVT Prophylaxis  :    enoxaparin (LOVENOX) injection 40 mg Start: 11/19/24 0800    Lab Results  Component Value Date   PLT 349 11/20/2024    Diet :  Diet Order             Diet NPO time specified Except for: Sips with Meds  Diet effective midnight                    Inpatient Medications  Scheduled Meds:  aspirin EC  81 mg Oral Daily   atorvastatin  80 mg Oral Daily   clopidogrel  75 mg Oral Daily   cyanocobalamin  1,000 mcg Oral Daily   enoxaparin (LOVENOX) injection  40 mg Subcutaneous Q24H   insulin aspart  0-5 Units Subcutaneous QHS   insulin aspart  0-9 Units Subcutaneous TID WC   nicotine  14 mg Transdermal Daily   pantoprazole  40 mg Oral Daily   pneumococcal 20-valent conjugate vaccine  0.5 mL Intramuscular Tomorrow-1000   Continuous Infusions: PRN Meds:.acetaminophen  **OR** acetaminophen  (TYLENOL ) oral liquid 160 mg/5 mL **OR** acetaminophen , albuterol, melatonin, nicotine polacrilex, senna-docusate  Antibiotics  :    Anti-infectives (From admission, onward)    None         Objective:    Vitals:   11/19/24 2048 11/20/24 0031 11/20/24 0425 11/20/24 0808  BP: 135/84 (!) 140/75 (!) 148/82 130/77  Pulse: 85 68 73 69  Resp:      Temp: (!) 97.5 F (36.4 C) 97.8 F (36.6 C) (!) 97 F (36.1 C) 97.6 F (36.4 C)  TempSrc: Oral Oral Axillary Oral  SpO2:        Wt Readings from Last 3 Encounters:  06/20/18 67.8 kg  03/07/18 132.5 kg    No intake or output data in the 24 hours ending 11/20/24 9061   Physical Exam  Awake Alert, No new F.N deficits, Normal affect [place].AT,PERRAL Supple Neck, No JVD,   Symmetrical Chest wall movement, Good air movement bilaterally, CTAB RRR,No Gallops,Rubs or new Murmurs,  +ve B.Sounds, Abd Soft, No tenderness,   No Cyanosis, Clubbing or edema       Data Review:    Recent Labs  Lab 11/18/24 2122 11/19/24 0814 11/20/24 0423  WBC 9.8 8.5 8.1  HGB 12.0 11.0* 11.4*  HCT 38.3 34.4* 35.8*  PLT 395 317 349  MCV 82.9 82.1 81.9  MCH 26.0 26.3 26.1  MCHC 31.3 32.0 31.8  RDW 15.9* 16.1* 16.0*    Recent Labs  Lab 11/18/24 2122 11/19/24 0814 11/19/24 1203 11/20/24 0423  NA 142 138  --  142  K 3.5 3.7  --  3.9  CL 102 103  --  101  CO2 30 26  --  30  ANIONGAP 10 9  --  11  GLUCOSE 104* 99  --  100*  BUN 21  22  --  20  CREATININE 1.09* 0.89  --  1.02*  AST 23  --   --   --   ALT 21  --   --   --   ALKPHOS 64  --   --   --   BILITOT 0.4  --   --   --   ALBUMIN 3.9  --   --   --   INR 1.0  --   --   --   HGBA1C  --  6.3*  --   --   MG  --   --  2.2  --   CALCIUM 9.3 8.6*  --  8.9      Recent Labs  Lab 11/18/24 2122 11/19/24 0814 11/19/24 1203 11/20/24 0423  INR 1.0  --   --   --   HGBA1C  --  6.3*  --   --   MG  --   --  2.2  --   CALCIUM 9.3 8.6*  --  8.9    --------------------------------------------------------------------------------------------------------------- Lab Results  Component Value Date   CHOL 194 11/19/2024   HDL 54 11/19/2024   LDLCALC 111 (H) 11/19/2024   TRIG 145 11/19/2024   CHOLHDL  3.6 11/19/2024    Lab Results  Component Value Date   HGBA1C 6.3 (H) 11/19/2024   No results for input(s): TSH, T4TOTAL, FREET4, T3FREE, THYROIDAB in the last 72 hours. No results for input(s): VITAMINB12, FOLATE, FERRITIN, TIBC, IRON, RETICCTPCT in the last 72 hours. ------------------------------------------------------------------------------------------------------------------ Cardiac Enzymes No results for input(s): CKMB, TROPONINI, MYOGLOBIN in the last 168 hours.  Invalid input(s): CK  Micro Results No results found for this or any previous visit (from the past 240 hours).  Radiology Report MR CARDIAC MORPHOLOGY W WO CONTRAST Result Date: 11/19/2024 CLINICAL DATA:  Mitral valve mass COMPARISON: Echo 11/19/24 EXAM: MR CARDIA MORPHOLOGY WITHOUT AND WITH CONTRAST; MR CARDIAC VELOCITY FLOW MAPPING TECHNIQUE: The patient was scanned on a 1.5 Tesla Siemens magnet. A dedicated cardiac coil was used. Functional imaging was done using TrueFisp sequences. 2,3, and 4 chamber views were done to assess for RWMA's. Modified Simpson's rule using a short axis stack was used to calculate an ejection fraction on a dedicated work [employer]. The patient received 10mL GADAVIST GADOBUTROL 1 MMOL/ML IV SOLN. After 10 minutes inversion recovery sequences were used to assess for infiltration and scar tissue. Phase contrast velocity encoded images obtained x 2. This examination is tailored for evaluation cardiac anatomy and function and provides very limited assessment of noncardiac structures, which are accordingly not evaluated during interpretation. If there is clinical concern for extracardiac pathology, further evaluation with CT imaging should be considered. FINDINGS: LEFT VENTRICLE: Left ventricular chamber size: Mildly dilated by indexed volumes. Left ventricular wall thickness: Normal. Left ventricular systolic function: Mildly decreased LVEF = 41%, by  biplane volumes 45%. Prominent apical trabeculations without definite evidence of noncompaction cardiomyopathy. Global hypokinesis with subtle mid-apical lateral wall moderate hypokinesis. No myocardial edema, T2 = 48 msec Normal first pass perfusion. There is post contrast delayed myocardial enhancement: Small focus of mid-apical lateral wall LGE, likely subendocardial-midmyocardial, most suggestive of a prior small infarct, possibly embolic. Alternatively, may represent small focus of prior myocarditis. Normal T1 myocardial nulling kinetics suggest against a diagnosis of cardiac amyloidosis. ECV = 30% RIGHT VENTRICLE: Normal right ventricular chamber size. Normal right ventricular wall thickness. Mildly reduced right ventricular systolic function. RVEF = 49% There are no regional wall motion abnormalities. No  post contrast delayed myocardial enhancement. ATRIA: Normal biatrial size with redundant atrial septum. PERICARDIUM: Normal pericardium.  Trivial pericardial effusion. OTHER: No significant extracardiac findings. MEASUREMENTS: Qp/Qs: 1.01 VALVES: Aortic valve regurgitation: Mild, regurgitant fraction 6% Pulmonary valve regurgitation: Trivial, regurgitant fraction 1% Mitral valve regurgitation: Moderate, regurgitant fraction 30% Tricuspid valve regurgitation: Mild-moderate, regurgitant fraction 20% Left ventricle: LV female LV EF: 41 % (Normal 52-79%) Absolute volumes: LV EDV: (Normal 78-167 mL) LV ESV: (Normal 21-64 mL) LV SV: 70mL (Normal 52-114 mL) CO: 5.2L/min (Normal 2.7-6.3 L/min) Indexed volumes: CI: 3.0L/min/sq-m (Normal 1.9-3.9 L/min/sq-m) LV EDV: 71mL/sq-m (Normal 50-96 mL/sq-m) LV ESV: 16mL/sq-m (Normal 10-40 mL/sq-m) LV SV: 83mL/sq-m (Normal 33-64 mL/sq-m) Right ventricle: RV female RV EF: 49% (normal 52-80%) Absolute volumes: RV EDV: (Normal 79-175 mL) RV ESV: 62mL (Normal 13-75 mL) RV SV: 59mL (Normal 56-110 mL) CO: 4.4L/min (Normal 2.7-6 L/min) Indexed volumes: CI:  2.5L/min/sq-m (Normal 1.8-3.8 L/min/sq-m) RV EDV: 73mL/sq-m (Normal 51-97 mL/sq-m) RV ESV: 29mL/sq-m (Normal 9-42 mL/sq-m) RV SV: 43mL/sq-m (Normal 35-61 mL/sq-m) IMPRESSION: 1. 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 Unable to characterize. Consider TEE. 2. Mildly reduced LV systolic function with mild LV dilation by indexed volume. LVEF 41%, visually 45%, biplane volume calculated 45%. There is a small focus of mid-apical inferolateral wall delayed enhancement, likely subendocardial-midmyocardial, most suggestive of a prior small infarct, possibly embolic. Alternatively, may represent small focus of prior myocarditis. There is an associated mid-apical lateral wall regional wall motion abnormality. 3. Normal right ventricular chamber size with mildly reduced RV systolic function, RVEF 49%. 4. Moderate mitral valve regurgitation, mild-moderate tricuspid valve regurgitation. Electronically Signed   By: Soyla Merck M.D.   On: 11/19/2024 22:35   MR CARDIAC VELOCITY FLOW MAP Result Date: 11/19/2024 CLINICAL DATA:  Mitral valve mass COMPARISON: Echo 11/19/24 EXAM: MR CARDIA MORPHOLOGY WITHOUT AND WITH CONTRAST; MR CARDIAC VELOCITY FLOW MAPPING TECHNIQUE: The patient was scanned on a 1.5 Tesla Siemens magnet. A dedicated cardiac coil was used. Functional imaging was done using TrueFisp sequences. 2,3, and 4 chamber views were done to assess for RWMA's. Modified Simpson's rule using a short axis stack was used to calculate an ejection fraction on a dedicated work [employer]. The patient received 10mL GADAVIST GADOBUTROL 1 MMOL/ML IV SOLN. After 10 minutes inversion recovery sequences were used to assess for infiltration and scar tissue. Phase contrast velocity encoded images obtained x 2. This examination is tailored for evaluation cardiac anatomy and function and provides very limited assessment of noncardiac  structures, which are accordingly not evaluated during interpretation. If there is clinical concern for extracardiac pathology, further evaluation with CT imaging should be considered. FINDINGS: LEFT VENTRICLE: Left ventricular chamber size: Mildly dilated by indexed volumes. Left ventricular wall thickness: Normal. Left ventricular systolic function: Mildly decreased LVEF = 41%, by biplane volumes 45%. Prominent apical trabeculations without definite evidence of noncompaction cardiomyopathy. Global hypokinesis with subtle mid-apical lateral wall moderate hypokinesis. No myocardial edema, T2 = 48 msec Normal first pass perfusion. There is post contrast delayed myocardial enhancement: Small focus of mid-apical lateral wall LGE, likely subendocardial-midmyocardial, most suggestive of a prior small infarct, possibly embolic. Alternatively, may represent small focus of prior myocarditis. Normal T1 myocardial nulling kinetics suggest against a diagnosis of cardiac amyloidosis. ECV = 30% RIGHT VENTRICLE: Normal right ventricular chamber size. Normal right ventricular wall thickness. Mildly reduced right ventricular systolic function. RVEF = 49% There  are no regional wall motion abnormalities. No post contrast delayed myocardial enhancement. ATRIA: Normal biatrial size with redundant atrial septum. PERICARDIUM: Normal pericardium.  Trivial pericardial effusion. OTHER: No significant extracardiac findings. MEASUREMENTS: Qp/Qs: 1.01 VALVES: Aortic valve regurgitation: Mild, regurgitant fraction 6% Pulmonary valve regurgitation: Trivial, regurgitant fraction 1% Mitral valve regurgitation: Moderate, regurgitant fraction 30% Tricuspid valve regurgitation: Mild-moderate, regurgitant fraction 20% Left ventricle: LV female LV EF: 41 % (Normal 52-79%) Absolute volumes: LV EDV: (Normal 78-167 mL) LV ESV: (Normal 21-64 mL) LV SV: 70mL (Normal 52-114 mL) CO: 5.2L/min (Normal 2.7-6.3 L/min) Indexed volumes: CI:  3.0L/min/sq-m (Normal 1.9-3.9 L/min/sq-m) LV EDV: 32mL/sq-m (Normal 50-96 mL/sq-m) LV ESV: 58mL/sq-m (Normal 10-40 mL/sq-m) LV SV: 21mL/sq-m (Normal 33-64 mL/sq-m) Right ventricle: RV female RV EF: 49% (normal 52-80%) Absolute volumes: RV EDV: (Normal 79-175 mL) RV ESV: 62mL (Normal 13-75 mL) RV SV: 59mL (Normal 56-110 mL) CO: 4.4L/min (Normal 2.7-6 L/min) Indexed volumes: CI: 2.5L/min/sq-m (Normal 1.8-3.8 L/min/sq-m) RV EDV: 50mL/sq-m (Normal 51-97 mL/sq-m) RV ESV: 60mL/sq-m (Normal 9-42 mL/sq-m) RV SV: 77mL/sq-m (Normal 35-61 mL/sq-m) IMPRESSION: 1. 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 Unable to characterize. Consider TEE. 2. Mildly reduced LV systolic function with mild LV dilation by indexed volume. LVEF 41%, visually 45%, biplane volume calculated 45%. There is a small focus of mid-apical inferolateral wall delayed enhancement, likely subendocardial-midmyocardial, most suggestive of a prior small infarct, possibly embolic. Alternatively, may represent small focus of prior myocarditis. There is an associated mid-apical lateral wall regional wall motion abnormality. 3. Normal right ventricular chamber size with mildly reduced RV systolic function, RVEF 49%. 4. Moderate mitral valve regurgitation, mild-moderate tricuspid valve regurgitation. Electronically Signed   By: Soyla Merck M.D.   On: 11/19/2024 22:35   MR CARDIAC VELOCITY FLOW MAP Result Date: 11/19/2024 CLINICAL DATA:  Mitral valve mass COMPARISON: Echo 11/19/24 EXAM: MR CARDIA MORPHOLOGY WITHOUT AND WITH CONTRAST; MR CARDIAC VELOCITY FLOW MAPPING TECHNIQUE: The patient was scanned on a 1.5 Tesla Siemens magnet. A dedicated cardiac coil was used. Functional imaging was done using TrueFisp sequences. 2,3, and 4 chamber views were done to assess for RWMA's. Modified Simpson's rule using a short axis stack was used to calculate an ejection fraction on a  dedicated work [employer]. The patient received 10mL GADAVIST GADOBUTROL 1 MMOL/ML IV SOLN. After 10 minutes inversion recovery sequences were used to assess for infiltration and scar tissue. Phase contrast velocity encoded images obtained x 2. This examination is tailored for evaluation cardiac anatomy and function and provides very limited assessment of noncardiac structures, which are accordingly not evaluated during interpretation. If there is clinical concern for extracardiac pathology, further evaluation with CT imaging should be considered. FINDINGS: LEFT VENTRICLE: Left ventricular chamber size: Mildly dilated by indexed volumes. Left ventricular wall thickness: Normal. Left ventricular systolic function: Mildly decreased LVEF = 41%, by biplane volumes 45%. Prominent apical trabeculations without definite evidence of noncompaction cardiomyopathy. Global hypokinesis with subtle mid-apical lateral wall moderate hypokinesis. No myocardial edema, T2 = 48 msec Normal first pass perfusion. There is post contrast delayed myocardial enhancement: Small focus of mid-apical lateral wall LGE, likely subendocardial-midmyocardial, most suggestive of a prior small infarct, possibly embolic. Alternatively, may represent small focus of prior myocarditis. Normal T1 myocardial nulling kinetics suggest against a diagnosis of cardiac amyloidosis. ECV = 30% RIGHT VENTRICLE: Normal right ventricular chamber size. Normal right ventricular wall thickness. Mildly reduced right  ventricular systolic function. RVEF = 49% There are no regional wall motion abnormalities. No post contrast delayed myocardial enhancement. ATRIA: Normal biatrial size with redundant atrial septum. PERICARDIUM: Normal pericardium.  Trivial pericardial effusion. OTHER: No significant extracardiac findings. MEASUREMENTS: Qp/Qs: 1.01 VALVES: Aortic valve regurgitation: Mild, regurgitant fraction 6% Pulmonary valve regurgitation: Trivial,  regurgitant fraction 1% Mitral valve regurgitation: Moderate, regurgitant fraction 30% Tricuspid valve regurgitation: Mild-moderate, regurgitant fraction 20% Left ventricle: LV female LV EF: 41 % (Normal 52-79%) Absolute volumes: LV EDV: (Normal 78-167 mL) LV ESV: (Normal 21-64 mL) LV SV: 70mL (Normal 52-114 mL) CO: 5.2L/min (Normal 2.7-6.3 L/min) Indexed volumes: CI: 3.0L/min/sq-m (Normal 1.9-3.9 L/min/sq-m) LV EDV: 45mL/sq-m (Normal 50-96 mL/sq-m) LV ESV: 81mL/sq-m (Normal 10-40 mL/sq-m) LV SV: 7mL/sq-m (Normal 33-64 mL/sq-m) Right ventricle: RV female RV EF: 49% (normal 52-80%) Absolute volumes: RV EDV: (Normal 79-175 mL) RV ESV: 62mL (Normal 13-75 mL) RV SV: 59mL (Normal 56-110 mL) CO: 4.4L/min (Normal 2.7-6 L/min) Indexed volumes: CI: 2.5L/min/sq-m (Normal 1.8-3.8 L/min/sq-m) RV EDV: 37mL/sq-m (Normal 51-97 mL/sq-m) RV ESV: 50mL/sq-m (Normal 9-42 mL/sq-m) RV SV: 28mL/sq-m (Normal 35-61 mL/sq-m) IMPRESSION: 1. 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 Unable to characterize. Consider TEE. 2. Mildly reduced LV systolic function with mild LV dilation by indexed volume. LVEF 41%, visually 45%, biplane volume calculated 45%. There is a small focus of mid-apical inferolateral wall delayed enhancement, likely subendocardial-midmyocardial, most suggestive of a prior small infarct, possibly embolic. Alternatively, may represent small focus of prior myocarditis. There is an associated mid-apical lateral wall regional wall motion abnormality. 3. Normal right ventricular chamber size with mildly reduced RV systolic function, RVEF 49%. 4. Moderate mitral valve regurgitation, mild-moderate tricuspid valve regurgitation. Electronically Signed   By: Soyla Merck M.D.   On: 11/19/2024 22:35   ECHOCARDIOGRAM COMPLETE Result Date: 11/19/2024    ECHOCARDIOGRAM REPORT   Patient Name:   [facility] Date of Exam:  11/19/2024 Medical Rec #:  [id number]      Height:       62.0 in Accession #:    [id number]     Weight:       149.4 lb Date of Birth:  08/20/1956      BSA:          1.689 m Patient Age:    68 years       BP:           135/72 mmHg Patient Gender: F              HR:           70 bpm. Exam Location:  Inpatient Procedure: 2D Echo (Both Spectral and Color Flow Doppler were utilized during            procedure). Indications:     Stroke  History:         Patient has no prior history of Echocardiogram examinations.                  Stroke.  Sonographer:     Norleen Amour Referring Phys:  [id number] KATY L FOUST Diagnosing Phys: Kardie Tobb DO IMPRESSIONS  1. Left ventricular ejection fraction, by estimation, is 45 to 50%. The left ventricle has mildly decreased function. The left ventricle has no regional wall motion abnormalities. Left ventricular diastolic parameters are consistent with Grade I diastolic dysfunction (impaired relaxation).  2. Right ventricular systolic function is  normal. The right ventricular size is normal. Tricuspid regurgitation signal is inadequate for assessing PA pressure.  3. Mobile mass on the chordae suspicious for thrombus vs calcification. May benefit from Cardiac MR in the settting of recent CVA. SABRA The mitral valve is normal in structure. Mild mitral valve regurgitation. No evidence of mitral stenosis.  4. The aortic valve is normal in structure. Aortic valve regurgitation is not visualized. No aortic stenosis is present.  5. The inferior vena cava is normal in size with greater than 50% respiratory variability, suggesting right atrial pressure of 3 mmHg. FINDINGS  Left Ventricle: Left ventricular ejection fraction, by estimation, is 45 to 50%. The left ventricle has mildly decreased function. The left ventricle has no regional wall motion abnormalities. The left ventricular internal cavity size was normal in size. There is no left ventricular hypertrophy. Left ventricular diastolic parameters are  consistent with Grade I diastolic dysfunction (impaired relaxation). Right Ventricle: The right ventricular size is normal. No increase in right ventricular wall thickness. Right ventricular systolic function is normal. Tricuspid regurgitation signal is inadequate for assessing PA pressure. Left Atrium: Left atrial size was normal in size. Right Atrium: Right atrial size was normal in size. Pericardium: There is no evidence of pericardial effusion. Presence of epicardial fat layer. Mitral Valve: Mobile mass on the chordae suspicious for thrombus vs calcification. May benefit from Cardiac MR in the settting of recent CVA. The mitral valve is normal in structure. There is moderate thickening of the mitral valve leaflet(s). Mild mitral valve regurgitation. No evidence of mitral valve stenosis. MV peak gradient, 3.5 mmHg. The mean mitral valve gradient is 2.0 mmHg. Tricuspid Valve: The tricuspid valve is normal in structure. Tricuspid valve regurgitation is not demonstrated. No evidence of tricuspid stenosis. Aortic Valve: The aortic valve is normal in structure. Aortic valve regurgitation is not visualized. No aortic stenosis is present. Aortic valve mean gradient measures 4.0 mmHg. Aortic valve peak gradient measures 7.5 mmHg. Aortic valve area, by VTI measures 2.51 cm. Pulmonic Valve: The pulmonic valve was normal in structure. Pulmonic valve regurgitation is not visualized. No evidence of pulmonic stenosis. Aorta: The aortic root is normal in size and structure. Venous: The inferior vena cava is normal in size with greater than 50% respiratory variability, suggesting right atrial pressure of 3 mmHg. IAS/Shunts: No atrial level shunt detected by color flow Doppler.  LEFT VENTRICLE PLAX 2D LVIDd:         5.00 cm     Diastology LVIDs:         3.10 cm     LV e' medial:    5.61 cm/s LV PW:         0.90 cm     LV E/e' medial:  18.0 LV IVS:        1.00 cm     LV e' lateral:   10.30 cm/s LVOT diam:     2.20 cm     LV E/e'  lateral: 9.8 LV SV:         68 LV SV Index:   40 LVOT Area:     3.80 cm LV IVRT:       106 msec  LV Volumes (MOD) LV vol d, MOD A2C: 56.1 ml LV vol d, MOD A4C: 83.3 ml LV vol s, MOD A2C: 23.6 ml LV vol s, MOD A4C: 47.3 ml LV SV MOD A2C:     32.5 ml LV SV MOD A4C:     83.3 ml LV SV MOD  BP:      35.2 ml RIGHT VENTRICLE RV Basal diam:  3.40 cm     PULMONARY VEINS RV S prime:     13.40 cm/s  Diastolic Velocity: 52.30 cm/s TAPSE (M-mode): 1.9 cm      S/D Velocity:       0.80                             Systolic Velocity:  43.20 cm/s LEFT ATRIUM             Index        RIGHT ATRIUM           Index LA diam:        3.70 cm 2.19 cm/m   RA Area:     13.20 cm LA Vol (A2C):   52.4 ml 31.03 ml/m  RA Volume:   32.90 ml  19.48 ml/m LA Vol (A4C):   40.2 ml 23.80 ml/m LA Biplane Vol: 46.4 ml 27.48 ml/m  AORTIC VALVE                    PULMONIC VALVE AV Area (Vmax):    2.13 cm     PV Vmax:       1.13 m/s AV Area (Vmean):   2.24 cm     PV Peak grad:  5.1 mmHg AV Area (VTI):     2.51 cm AV Vmax:           137.00 cm/s AV Vmean:          98.600 cm/s AV VTI:            0.270 m AV Peak Grad:      7.5 mmHg AV Mean Grad:      4.0 mmHg LVOT Vmax:         76.60 cm/s LVOT Vmean:        58.000 cm/s LVOT VTI:          0.178 m LVOT/AV VTI ratio: 0.66  AORTA Ao Root diam: 2.50 cm Ao Asc diam:  2.90 cm MITRAL VALVE MV Area (PHT): 3.68 cm     SHUNTS MV Area VTI:   2.38 cm     Systemic VTI:  0.18 m MV Peak grad:  3.5 mmHg     Systemic Diam: 2.20 cm MV Mean grad:  2.0 mmHg MV Vmax:       0.93 m/s MV Vmean:      65.7 cm/s MV Decel Time: 206 msec MV E velocity: 101.00 cm/s MV A velocity: 91.70 cm/s MV E/A ratio:  1.10 Kardie Tobb DO Electronically signed by Dub Huntsman DO Signature Date/Time: 11/19/2024/9:43:41 AM    Final (Updated)    CT ANGIO HEAD NECK W WO CM Result Date: 11/19/2024 EXAM: CTA HEAD AND NECK WITHOUT AND WITH IV CONTRAST 11/19/2024 02:36:38 AM TECHNIQUE: CTA of the head and neck was performed without and with the  administration of 75 mL of iohexol (OMNIPAQUE) 350 mg/mL injection. Multiplanar 2D and/or 3D reformatted images are provided for review. Automated exposure control, iterative reconstruction, and/or weight based adjustment of the mA/kV was utilized to reduce the radiation dose to as low as reasonably achievable. Stenosis of the internal carotid arteries measured using NASCET criteria. COMPARISON: Brain MRI 11/19/2024 CLINICAL HISTORY: Transient ischemic attack (TIA) FINDINGS: CTA NECK: AORTIC ARCH AND ARCH VESSELS: Aortic atherosclerosis. No dissection or arterial injury. No significant stenosis of the brachiocephalic or subclavian  arteries. CERVICAL CAROTID ARTERIES: Tortuous course of the right common carotid artery. Mild atherosclerosis at the right carotid bifurcation and proximal internal carotid artery with less than 50% stenosis. Atherosclerotic calcification at the left carotid bifurcation without hemodynamically significant stenosis. No dissection or arterial injury. 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 Normal right vertebral artery. Mild atherosclerotic calcification of the left vertebral artery V4 segment. No dissection or arterial injury. LUNGS AND MEDIASTINUM: Unremarkable. SOFT TISSUES: No acute abnormality. BONES: No acute abnormality. CTA HEAD: ANTERIOR CIRCULATION: Atherosclerotic calcification of the carotid siphons with mild stenoses of the cavernous segments. 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 No significant stenosis of the anterior cerebral arteries. No significant stenosis of the middle cerebral arteries. No aneurysm. POSTERIOR CIRCULATION: No significant stenosis of the posterior cerebral arteries. No significant stenosis of the basilar artery. No significant stenosis of the vertebral arteries. No aneurysm. OTHER: No dural venous sinus thrombosis on this non-dedicated study. IMPRESSION: 1.  No emergent large vessel occlusion. 2. 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 3. 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 4. Mild atherosclerosis at the carotid bifurcations with less than 50% stenosis. Electronically signed by: Franky Stanford MD 11/19/2024 02:54 AM EST RP Workstation: [id number]   MR BRAIN WO CONTRAST Result Date: 11/19/2024 EXAM: MRI BRAIN WITHOUT CONTRAST 11/19/2024 02:07:30 AM TECHNIQUE: Multiplanar multisequence MRI of the head/brain was performed without the administration of intravenous contrast. COMPARISON: None available. CLINICAL HISTORY: Neuro deficit, acute, stroke suspected. FINDINGS: BRAIN AND VENTRICLES: Multiple small foci of abnormal diffusion restriction are present, including 1 along the interhemispheric fissure at the cingulate gyrus and another punctate lesion within the right midbrain. There is an area of hyperintensity on diffusion-weighted imaging within the left frontal corona radiata with no ADC correlate. 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 No intracranial hemorrhage. No mass. No midline shift. No hydrocephalus. The sella is unremarkable. Normal flow voids. ORBITS: No acute abnormality. SINUSES AND MASTOIDS: No acute abnormality. BONES AND SOFT TISSUES: Normal marrow signal. No acute soft tissue abnormality. IMPRESSION: 1. Multiple small foci of acute/early subacute ischemia, including along the interhemispheric fissure at the cingulate gyrus and a punctate lesion within the right midbrain. 2. 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 Electronically signed by: Franky Stanford MD 11/19/2024 02:21 AM EST RP Workstation: [id number]   CT HEAD WO CONTRAST Result Date: 11/18/2024 CLINICAL DATA:  Intermittent right leg  numbness. EXAM: CT HEAD WITHOUT CONTRAST TECHNIQUE: Contiguous axial images were obtained from the base of the skull through the vertex without intravenous contrast. RADIATION DOSE REDUCTION: This exam was performed according to the departmental dose-optimization program which includes automated exposure control, adjustment of the mA and/or kV according to patient size and/or use of iterative reconstruction technique. COMPARISON:  None Available. FINDINGS: Brain: No evidence of acute infarction, hemorrhage, hydrocephalus, extra-axial collection or mass lesion/mass effect. Vascular: No hyperdense vessel or unexpected calcification. Skull: Normal. Negative for fracture or focal lesion. Sinuses/Orbits: No acute finding. Other: None. IMPRESSION: No acute intracranial pathology. Electronically Signed   By: Suzen Dials M.D.   On: 11/18/2024 22:51     Signature  -   Lavada Stank M.D on 11/20/2024 at 9:38 AM   -  To page go to [email or web address]

## 2024-11-20 NOTE — Interval H&P Note (Signed)
 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  The various methods of treatment have been discussed with the patient and family. After consideration of risks, benefits and other options for treatment, the patient has consented to  Procedure(s): TRANSESOPHAGEAL ECHOCARDIOGRAM (N/A) as a surgical intervention.  The patient's history has been reviewed, patient examined, no change in status, stable for surgery.  I have reviewed the patient's chart and labs.  Questions were answered to the patient's satisfaction.     Esiah Bazinet

## 2024-11-20 NOTE — TOC Transition Note (Signed)
 Transition of Care [facility]) - Discharge Note   Patient Details  Name: Kaylee Campbell MRN: [id number] Date of Birth: 1956-03-30  [facility] CM/SW Contact:  Landry DELENA Senters, RN Phone Number: 11/20/2024, 4:04 PM   Clinical Narrative:    Patient will be discharging to home with transportation from her daughter, who is at bedside.   Referral sent to [facility] neuro rehab for therapy. Info on AVS.  No further needs identified by CM.         Patient Goals and CMS Choice            Discharge Placement                       Discharge Plan and Services Additional resources added to the After Visit Summary for                                       Social Drivers of Health (SDOH) Interventions SDOH Screenings   Food Insecurity: No Food Insecurity (11/19/2024)  Housing: Low Risk  (11/19/2024)  Transportation Needs: No Transportation Needs (11/19/2024)  Utilities: Not At Risk (11/19/2024)  Social Connections: Moderately Integrated (11/19/2024)  Tobacco Use: High Risk (11/18/2024)     Readmission Risk Interventions     No data to display

## 2024-11-20 NOTE — Progress Notes (Signed)
  Echocardiogram Echocardiogram Transesophageal has been performed.  Koleen KANDICE Popper, RDCS 11/20/2024, 12:49 PM

## 2024-11-20 NOTE — Consult Note (Signed)
 Cardiology Consultation  Patient ID: Kaylee Campbell MRN: [id number]; DOB: Aug 15, 1956  Admit date: 11/18/2024 Date of Consult: 11/20/2024  PCP:  Gorge Ade, MD   [facility] Providers Cardiologist:  New    Patient Profile: Kaylee Campbell is a 68 y.o. 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 Dennise.  History of Present Illness: Ms. Kube has past medical history as listed above. She presented to [facility] ED on 11/18/2024 with extremity weakness. She reported bilateral LE numbness and weakness that started 11/30 AM. 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 Th etiology is likely multifactorial, there is concern for cardio embolic as well as small vessel disease.   She has been admitted to the medicine service and neurology has been seeing close in consult. 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 Mild atherosclerosis at the carotid bifurcations with less than 50% stenosis. Brain MRI Multiple small foci along the interhemispheric fissure at the cingulate gyrus and a punctate lesion within the R midbrain. Suspected subacute infarct.  TTE EF 45-50%, Possible thrombus vs Calcification on chordae at base of mitral valve Cardiac MRI The mitral valve mass seen on echocardiography is unable to be well visualized on MRI due to highly mobile mass and small size. Unable to characterize. Consider TEE. Mildly  reduced LV systolic function with mild LV dilation by indexed volume. LVEF 41%, visually 45%, biplane volume calculated 45%. There is a small focus of mid-apical inferolateral wall delayed enhancement, likely subendocardial-midmyocardial, most suggestive of a prior small infarct, possibly embolic. Alternatively, may represent small focus of prior myocarditis. There is an associated mid-apical lateral wall regional wall motion abnormality. Normal right ventricular chamber size with mildly reduced RV systolic function, RVEF 49%. Moderate mitral valve regurgitation, mild-moderate tricuspid valve regurgitation TEE: pending formal report   Cardiology has been asked to see in the setting of TEE finding of a mobile mass noted on chordae tendinae of the anterior mitral leaflet that may require CT surgery to intervene.   I spoke with the patient after returning to her room on 5W after undergoing her TEE. She reports that she is overall feeling good. She tells me that as an outpatient she was only on medication for her hypertension and taking B12. Her dispense report shows she was on triamterene-hydrochlorothiazide 37.5-25 mg daily. Per patient report she has been ambulating without any issues. She tells me that she has regained most of her strength, she is able to move all her extremities without issues.   Past Medical History:  Diagnosis Date   Asthma    Hyperlipemia    Hypertension    Past Surgical History:  Procedure Laterality Date   ABDOMINAL HYSTERECTOMY     CESAREAN SECTION     UTERINE FIBROID SURGERY      Home Medications:  Prior to Admission medications   Medication Sig Start Date End Date Taking? Authorizing Provider  cyanocobalamin 1000 MCG tablet Take 1,000 mcg by mouth in the morning.  Yes [provider]  triamterene-hydrochlorothiazide (MAXZIDE-25) 37.5-25 MG per tablet Take 1 tablet by mouth in the morning.   Yes [provider]   Scheduled Meds:  aspirin EC  81  mg Oral Daily   atorvastatin  80 mg Oral Daily   clopidogrel  75 mg Oral Daily   cyanocobalamin  1,000 mcg Oral Daily   enoxaparin (LOVENOX) injection  40 mg Subcutaneous Q24H   insulin aspart  0-5 Units Subcutaneous QHS   insulin aspart  0-9 Units Subcutaneous TID WC   nicotine  14 mg Transdermal Daily   pantoprazole  40 mg Oral Daily   pneumococcal 20-valent conjugate vaccine  0.5 mL Intramuscular Tomorrow-1000   Continuous Infusions:  PRN Meds: acetaminophen  **OR** acetaminophen  (TYLENOL ) oral liquid 160 mg/5 mL **OR** acetaminophen , albuterol, melatonin, nicotine polacrilex, senna-docusate  Allergies:   No Known Allergies  Social History:   Social History   Socioeconomic History   Marital status: Married    Spouse name: Not on file   Number of children: Not on file   Years of education: Not on file   Highest education level: Not on file  Occupational History   Not on file  Tobacco Use   Smoking status: Every Day    Types: Cigarettes   Smokeless tobacco: Never  Substance and Sexual Activity   Alcohol use: Yes   Drug use: Not on file   Sexual activity: Not on file  Other Topics Concern   Not on file  Social History Narrative   Not on file   Social Drivers of Health   Financial Resource Strain: Not on file  Food Insecurity: No Food Insecurity (11/19/2024)   Hunger Vital Sign    Worried About Running Out of Food in the Last Year: Never true    Ran Out of Food in the Last Year: Never true  Transportation Needs: No Transportation Needs (11/19/2024)   PRAPARE - Administrator, Civil Service (Medical): No    Lack of Transportation (Non-Medical): No  Physical Activity: Not on file  Stress: Not on file  Social Connections: Moderately Integrated (11/19/2024)   Social Connection and Isolation Panel    Frequency of Communication with Friends and Family: More than three times a week    Frequency of Social Gatherings with Friends and Family: More than three  times a week    Attends Religious Services: More than 4 times per year    Active Member of [employer] or Organizations: No    Attends Banker Meetings: Never    Marital Status: Married  Catering Manager Violence: Not At Risk (11/19/2024)   Humiliation, Afraid, Rape, and Kick questionnaire    Fear of Current or Ex-Partner: No    Emotionally Abused: No    Physically Abused: No    Sexually Abused: No    Family History:   Family History  Problem Relation Age of Onset   Diabetes Father    Hypertension Father    Cancer Mother    Heart failure Mother    Hypertension Mother     ROS:  Please see the history of present illness.  All other ROS reviewed and negative.     Physical Exam/Data: Vitals:   11/20/24 1305 11/20/24 1310 11/20/24 1315 11/20/24 1320  BP: 122/69 120/74 128/74 (!) 143/73  Pulse: 86 84 74 69  Resp: (!) 24 (!) 22 19 14   Temp:      TempSrc:      SpO2: 97% 96% 97%  97%    Intake/Output Summary (Last 24 hours) at 11/20/2024 1505 Last data filed at 11/20/2024 1243 Gross per 24 hour  Intake 200 ml  Output --  Net 200 ml      06/20/2018    8:18 AM 03/07/2018    8:08 AM  Last 3 Weights  Weight (lbs) 149 lb 6.4 oz 292 lb  Weight (kg) 67.767 kg 132.45 kg     There is no height or weight on file to calculate BMI.   General:  in no acute distress HEENT: normal Neck: no JVD Vascular: Distal pulses 2+ bilaterally Cardiac:  normal S1, S2; RRR; no murmur  Lungs:  clear to auscultation bilaterally Abd: soft, nontender, no hepatomegaly  Ext: no edema Musculoskeletal:  No deformities Skin: warm and dry  Neuro:  no focal abnormalities noted Psych:  Normal affect   EKG:  The EKG was personally reviewed and demonstrates: sinus rhythm, HR 62  Relevant CV Studies:  Echocardiogram, 11/19/2024 Left ventricular ejection fraction, by estimation, is 45 to 50% . The left ventricle has mildly decreased function. The left ventricle has no regional wall motion  abnormalities. Left ventricular diastolic parameters are consistent with Grade I diastolic dysfunction ( impaired relaxation) .  Right ventricular systolic function is normal. The right ventricular size is normal. Tricuspid regurgitation signal is inadequate for assessing PA pressure.  Mobile mass on the chordae suspicious for thrombus vs calcification. May benefit from Cardiac MR in the settting of recent CVA. SABRA The mitral valve is normal in structure. Mild mitral valve regurgitation. No evidence of mitral stenosis.  The aortic valve is normal in structure. Aortic valve regurgitation is not visualized. No aortic stenosis is present.  The inferior vena cava is normal in size with greater than 50% respiratory variability, suggesting right atrial pressure of 3 mmHg.  Cardiac MRI, 11/19/2024 The mitral valve mass seen on echocardiography is unable to be well visualized on MRI due to highly mobile mass and small size. Unable to characterize. Consider TEE. Mildly reduced LV systolic function with mild LV dilation by indexed volume. LVEF 41%, visually 45%, biplane volume calculated 45%. There is a small focus of mid-apical inferolateral wall delayed enhancement, likely  subendocardial-midmyocardial, most suggestive of a prior small infarct, possibly embolic. Alternatively, may represent small focus of prior myocarditis. There is an associated mid-apical lateral wall regional wall motion abnormality. Normal right ventricular chamber size with mildly reduced RV systolic function, RVEF 49%. Moderate mitral valve regurgitation, mild-moderate tricuspid valve regurgitatio  Transesophageal echocardiogram, 11/20/2024 Pending formal report    Laboratory Data: High Sensitivity Troponin:  No results for input(s): TROPONINIHS in the last 720 hours.   Chemistry Recent Labs  Lab 11/18/24 2122 11/19/24 0814 11/19/24 1203 11/20/24 0423  NA 142 138  --  142  K 3.5 3.7  --  3.9  CL 102 103  --  101  CO2 30 26  --   30  GLUCOSE 104* 99  --  100*  BUN 21 22  --  20  CREATININE 1.09* 0.89  --  1.02*  CALCIUM 9.3 8.6*  --  8.9  MG  --   --  2.2  --   GFRNONAA 55* >60  --  60*  ANIONGAP 10 9  --  11    Recent Labs  Lab 11/18/24 2122  PROT 6.8  ALBUMIN 3.9  AST 23  ALT 21  ALKPHOS 64  BILITOT 0.4   Lipids  Recent Labs  Lab 11/19/24 0814  CHOL 194  TRIG 145  HDL 54  LDLCALC 111*  CHOLHDL 3.6    Hematology Recent Labs  Lab 11/18/24 2122 11/19/24 0814 11/20/24 0423  WBC 9.8 8.5 8.1  RBC 4.62 4.19 4.37  HGB 12.0 11.0* 11.4*  HCT 38.3 34.4* 35.8*  MCV 82.9 82.1 81.9  MCH 26.0 26.3 26.1  MCHC 31.3 32.0 31.8  RDW 15.9* 16.1* 16.0*  PLT 395 317 349   Thyroid  No results for input(s): TSH, FREET4 in the last 168 hours.  BNPNo results for input(s): BNP, PROBNP in the last 168 hours.  DDimer No results for input(s): DDIMER in the last 168 hours.  Radiology/Studies:  EP STUDY Result Date: 11/20/2024 See surgical note for result.  MR CARDIAC MORPHOLOGY W WO CONTRAST Result Date: 11/19/2024 CLINICAL DATA:  Mitral valve mass COMPARISON: Echo 11/19/24 EXAM: MR CARDIA MORPHOLOGY WITHOUT AND WITH CONTRAST; MR CARDIAC VELOCITY FLOW MAPPING TECHNIQUE: The patient was scanned on a 1.5 Tesla Siemens magnet. A dedicated cardiac coil was used. Functional imaging was done using TrueFisp sequences. 2,3, and 4 chamber views were done to assess for RWMA's. Modified Simpson's rule using a short axis stack was used to calculate an ejection fraction on a dedicated work [employer]. The patient received 10mL GADAVIST GADOBUTROL 1 MMOL/ML IV SOLN. After 10 minutes inversion recovery sequences were used to assess for infiltration and scar tissue. Phase contrast velocity encoded images obtained x 2. This examination is tailored for evaluation cardiac anatomy and function and provides very limited assessment of noncardiac structures, which are accordingly not evaluated during  interpretation. If there is clinical concern for extracardiac pathology, further evaluation with CT imaging should be considered. FINDINGS: LEFT VENTRICLE: Left ventricular chamber size: Mildly dilated by indexed volumes. Left ventricular wall thickness: Normal. Left ventricular systolic function: Mildly decreased LVEF = 41%, by biplane volumes 45%. Prominent apical trabeculations without definite evidence of noncompaction cardiomyopathy. Global hypokinesis with subtle mid-apical lateral wall moderate hypokinesis. No myocardial edema, T2 = 48 msec Normal first pass perfusion. There is post contrast delayed myocardial enhancement: Small focus of mid-apical lateral wall LGE, likely subendocardial-midmyocardial, most suggestive of a prior small infarct, possibly embolic. Alternatively, may represent small focus of prior myocarditis. Normal T1 myocardial nulling kinetics suggest against a diagnosis of cardiac amyloidosis. ECV = 30% RIGHT VENTRICLE: Normal right ventricular chamber size. Normal right ventricular wall thickness. Mildly reduced right ventricular systolic function. RVEF = 49% There are no regional wall motion abnormalities. No post contrast delayed myocardial enhancement. ATRIA: Normal biatrial size with redundant atrial septum. PERICARDIUM: Normal pericardium.  Trivial pericardial effusion. OTHER: No significant extracardiac findings. MEASUREMENTS: Qp/Qs: 1.01 VALVES: Aortic valve regurgitation: Mild, regurgitant fraction 6% Pulmonary valve regurgitation: Trivial, regurgitant fraction 1% Mitral valve regurgitation: Moderate, regurgitant fraction 30% Tricuspid valve regurgitation: Mild-moderate, regurgitant fraction 20% Left ventricle: LV female LV EF: 41 % (Normal 52-79%) Absolute volumes: LV EDV: (Normal 78-167 mL) LV ESV: (Normal 21-64 mL) LV SV: 70mL (Normal 52-114 mL) CO: 5.2L/min (Normal 2.7-6.3 L/min) Indexed volumes: CI: 3.0L/min/sq-m (Normal 1.9-3.9 L/min/sq-m) LV EDV: 82mL/sq-m (Normal  50-96 mL/sq-m) LV ESV: 8mL/sq-m (Normal 10-40 mL/sq-m) LV SV: 63mL/sq-m (Normal 33-64 mL/sq-m) Right ventricle: RV female RV EF: 49% (normal 52-80%) Absolute volumes: RV EDV: (Normal 79-175 mL) RV ESV: 62mL (Normal 13-75 mL) RV SV: 59mL (Normal 56-110 mL) CO: 4.4L/min (Normal 2.7-6 L/min) Indexed volumes: CI: 2.5L/min/sq-m (Normal 1.8-3.8 L/min/sq-m) RV EDV: 66mL/sq-m (Normal 51-97 mL/sq-m) RV ESV: 32mL/sq-m (Normal 9-42 mL/sq-m) RV SV: 74mL/sq-m (Normal  35-61 mL/sq-m) IMPRESSION: 1. 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 Unable to characterize. Consider TEE. 2. Mildly reduced LV systolic function with mild LV dilation by indexed volume. LVEF 41%, visually 45%, biplane volume calculated 45%. There is a small focus of mid-apical inferolateral wall delayed enhancement, likely subendocardial-midmyocardial, most suggestive of a prior small infarct, possibly embolic. Alternatively, may represent small focus of prior myocarditis. There is an associated mid-apical lateral wall regional wall motion abnormality. 3. Normal right ventricular chamber size with mildly reduced RV systolic function, RVEF 49%. 4. Moderate mitral valve regurgitation, mild-moderate tricuspid valve regurgitation. Electronically Signed   By: Soyla Merck M.D.   On: 11/19/2024 22:35   MR CARDIAC VELOCITY FLOW MAP Result Date: 11/19/2024 CLINICAL DATA:  Mitral valve mass COMPARISON: Echo 11/19/24 EXAM: MR CARDIA MORPHOLOGY WITHOUT AND WITH CONTRAST; MR CARDIAC VELOCITY FLOW MAPPING TECHNIQUE: The patient was scanned on a 1.5 Tesla Siemens magnet. A dedicated cardiac coil was used. Functional imaging was done using TrueFisp sequences. 2,3, and 4 chamber views were done to assess for RWMA's. Modified Simpson's rule using a short axis stack was used to calculate an ejection fraction on a dedicated work [employer]. The patient received  10mL GADAVIST GADOBUTROL 1 MMOL/ML IV SOLN. After 10 minutes inversion recovery sequences were used to assess for infiltration and scar tissue. Phase contrast velocity encoded images obtained x 2. This examination is tailored for evaluation cardiac anatomy and function and provides very limited assessment of noncardiac structures, which are accordingly not evaluated during interpretation. If there is clinical concern for extracardiac pathology, further evaluation with CT imaging should be considered. FINDINGS: LEFT VENTRICLE: Left ventricular chamber size: Mildly dilated by indexed volumes. Left ventricular wall thickness: Normal. Left ventricular systolic function: Mildly decreased LVEF = 41%, by biplane volumes 45%. Prominent apical trabeculations without definite evidence of noncompaction cardiomyopathy. Global hypokinesis with subtle mid-apical lateral wall moderate hypokinesis. No myocardial edema, T2 = 48 msec Normal first pass perfusion. There is post contrast delayed myocardial enhancement: Small focus of mid-apical lateral wall LGE, likely subendocardial-midmyocardial, most suggestive of a prior small infarct, possibly embolic. Alternatively, may represent small focus of prior myocarditis. Normal T1 myocardial nulling kinetics suggest against a diagnosis of cardiac amyloidosis. ECV = 30% RIGHT VENTRICLE: Normal right ventricular chamber size. Normal right ventricular wall thickness. Mildly reduced right ventricular systolic function. RVEF = 49% There are no regional wall motion abnormalities. No post contrast delayed myocardial enhancement. ATRIA: Normal biatrial size with redundant atrial septum. PERICARDIUM: Normal pericardium.  Trivial pericardial effusion. OTHER: No significant extracardiac findings. MEASUREMENTS: Qp/Qs: 1.01 VALVES: Aortic valve regurgitation: Mild, regurgitant fraction 6% Pulmonary valve regurgitation: Trivial, regurgitant fraction 1% Mitral valve regurgitation: Moderate, regurgitant  fraction 30% Tricuspid valve regurgitation: Mild-moderate, regurgitant fraction 20% Left ventricle: LV female LV EF: 41 % (Normal 52-79%) Absolute volumes: LV EDV: (Normal 78-167 mL) LV ESV: (Normal 21-64 mL) LV SV: 70mL (Normal 52-114 mL) CO: 5.2L/min (Normal 2.7-6.3 L/min) Indexed volumes: CI: 3.0L/min/sq-m (Normal 1.9-3.9 L/min/sq-m) LV EDV: 43mL/sq-m (Normal 50-96 mL/sq-m) LV ESV: 37mL/sq-m (Normal 10-40 mL/sq-m) LV SV: 32mL/sq-m (Normal 33-64 mL/sq-m) Right ventricle: RV female RV EF: 49% (normal 52-80%) Absolute volumes: RV EDV: (Normal 79-175 mL) RV ESV: 62mL (Normal 13-75 mL) RV SV: 59mL (Normal 56-110 mL) CO: 4.4L/min (Normal 2.7-6 L/min) Indexed volumes: CI: 2.5L/min/sq-m (Normal 1.8-3.8 L/min/sq-m) RV EDV: 77mL/sq-m (Normal 51-97 mL/sq-m) RV ESV: 70mL/sq-m (  Normal 9-42 mL/sq-m) RV SV: 56mL/sq-m (Normal 35-61 mL/sq-m) IMPRESSION: 1. 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 Unable to characterize. Consider TEE. 2. Mildly reduced LV systolic function with mild LV dilation by indexed volume. LVEF 41%, visually 45%, biplane volume calculated 45%. There is a small focus of mid-apical inferolateral wall delayed enhancement, likely subendocardial-midmyocardial, most suggestive of a prior small infarct, possibly embolic. Alternatively, may represent small focus of prior myocarditis. There is an associated mid-apical lateral wall regional wall motion abnormality. 3. Normal right ventricular chamber size with mildly reduced RV systolic function, RVEF 49%. 4. Moderate mitral valve regurgitation, mild-moderate tricuspid valve regurgitation. Electronically Signed   By: Soyla Merck M.D.   On: 11/19/2024 22:35   MR CARDIAC VELOCITY FLOW MAP Result Date: 11/19/2024 CLINICAL DATA:  Mitral valve mass COMPARISON: Echo 11/19/24 EXAM: MR CARDIA MORPHOLOGY WITHOUT AND WITH CONTRAST; MR CARDIAC VELOCITY FLOW MAPPING  TECHNIQUE: The patient was scanned on a 1.5 Tesla Siemens magnet. A dedicated cardiac coil was used. Functional imaging was done using TrueFisp sequences. 2,3, and 4 chamber views were done to assess for RWMA's. Modified Simpson's rule using a short axis stack was used to calculate an ejection fraction on a dedicated work [employer]. The patient received 10mL GADAVIST GADOBUTROL 1 MMOL/ML IV SOLN. After 10 minutes inversion recovery sequences were used to assess for infiltration and scar tissue. Phase contrast velocity encoded images obtained x 2. This examination is tailored for evaluation cardiac anatomy and function and provides very limited assessment of noncardiac structures, which are accordingly not evaluated during interpretation. If there is clinical concern for extracardiac pathology, further evaluation with CT imaging should be considered. FINDINGS: LEFT VENTRICLE: Left ventricular chamber size: Mildly dilated by indexed volumes. Left ventricular wall thickness: Normal. Left ventricular systolic function: Mildly decreased LVEF = 41%, by biplane volumes 45%. Prominent apical trabeculations without definite evidence of noncompaction cardiomyopathy. Global hypokinesis with subtle mid-apical lateral wall moderate hypokinesis. No myocardial edema, T2 = 48 msec Normal first pass perfusion. There is post contrast delayed myocardial enhancement: Small focus of mid-apical lateral wall LGE, likely subendocardial-midmyocardial, most suggestive of a prior small infarct, possibly embolic. Alternatively, may represent small focus of prior myocarditis. Normal T1 myocardial nulling kinetics suggest against a diagnosis of cardiac amyloidosis. ECV = 30% RIGHT VENTRICLE: Normal right ventricular chamber size. Normal right ventricular wall thickness. Mildly reduced right ventricular systolic function. RVEF = 49% There are no regional wall motion abnormalities. No post contrast delayed myocardial  enhancement. ATRIA: Normal biatrial size with redundant atrial septum. PERICARDIUM: Normal pericardium.  Trivial pericardial effusion. OTHER: No significant extracardiac findings. MEASUREMENTS: Qp/Qs: 1.01 VALVES: Aortic valve regurgitation: Mild, regurgitant fraction 6% Pulmonary valve regurgitation: Trivial, regurgitant fraction 1% Mitral valve regurgitation: Moderate, regurgitant fraction 30% Tricuspid valve regurgitation: Mild-moderate, regurgitant fraction 20% Left ventricle: LV female LV EF: 41 % (Normal 52-79%) Absolute volumes: LV EDV: (Normal 78-167 mL) LV ESV: (Normal 21-64 mL) LV SV: 70mL (Normal 52-114 mL) CO: 5.2L/min (Normal 2.7-6.3 L/min) Indexed volumes: CI: 3.0L/min/sq-m (Normal 1.9-3.9 L/min/sq-m) LV EDV: 66mL/sq-m (Normal 50-96 mL/sq-m) LV ESV: 11mL/sq-m (Normal 10-40 mL/sq-m) LV SV: 45mL/sq-m (Normal 33-64 mL/sq-m) Right ventricle: RV female RV EF: 49% (normal 52-80%) Absolute volumes: RV EDV: (Normal 79-175 mL) RV ESV: 62mL (Normal 13-75 mL) RV SV: 59mL (Normal 56-110 mL) CO: 4.4L/min (Normal 2.7-6 L/min) Indexed volumes: CI: 2.5L/min/sq-m (Normal 1.8-3.8 L/min/sq-m) RV EDV:  55mL/sq-m (Normal 51-97 mL/sq-m) RV ESV: 57mL/sq-m (Normal 9-42 mL/sq-m) RV SV: 37mL/sq-m (Normal 35-61 mL/sq-m) IMPRESSION: 1. 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 Unable to characterize. Consider TEE. 2. Mildly reduced LV systolic function with mild LV dilation by indexed volume. LVEF 41%, visually 45%, biplane volume calculated 45%. There is a small focus of mid-apical inferolateral wall delayed enhancement, likely subendocardial-midmyocardial, most suggestive of a prior small infarct, possibly embolic. Alternatively, may represent small focus of prior myocarditis. There is an associated mid-apical lateral wall regional wall motion abnormality. 3. Normal right ventricular chamber size with mildly reduced RV  systolic function, RVEF 49%. 4. Moderate mitral valve regurgitation, mild-moderate tricuspid valve regurgitation. Electronically Signed   By: Soyla Merck M.D.   On: 11/19/2024 22:35   ECHOCARDIOGRAM COMPLETE Result Date: 11/19/2024    ECHOCARDIOGRAM REPORT   Patient Name:   TELESHA DEGUZMAN Date of Exam: 11/19/2024 Medical Rec #:  [id number]      Height:       62.0 in Accession #:    [id number]     Weight:       149.4 lb Date of Birth:  [birth year]      BSA:          1.689 m Patient Age:    68 years       BP:           135/72 mmHg Patient Gender: F              HR:           70 bpm. Exam Location:  Inpatient Procedure: 2D Echo (Both Spectral and Color Flow Doppler were utilized during            procedure). Indications:     Stroke  History:         Patient has no prior history of Echocardiogram examinations.                  Stroke.  Sonographer:     Norleen Amour Referring Phys:  [id number] KATY L FOUST Diagnosing Phys: Kardie Tobb DO IMPRESSIONS  1. Left ventricular ejection fraction, by estimation, is 45 to 50%. The left ventricle has mildly decreased function. The left ventricle has no regional wall motion abnormalities. Left ventricular diastolic parameters are consistent with Grade I diastolic dysfunction (impaired relaxation).  2. Right ventricular systolic function is normal. The right ventricular size is normal. Tricuspid regurgitation signal is inadequate for assessing PA pressure.  3. Mobile mass on the chordae suspicious for thrombus vs calcification. May benefit from Cardiac MR in the settting of recent CVA. SABRA The mitral valve is normal in structure. Mild mitral valve regurgitation. No evidence of mitral stenosis.  4. The aortic valve is normal in structure. Aortic valve regurgitation is not visualized. No aortic stenosis is present.  5. The inferior vena cava is normal in size with greater than 50% respiratory variability, suggesting right atrial pressure of 3 mmHg. FINDINGS  Left Ventricle: Left  ventricular ejection fraction, by estimation, is 45 to 50%. The left ventricle has mildly decreased function. The left ventricle has no regional wall motion abnormalities. The left ventricular internal cavity size was normal in size. There is no left ventricular hypertrophy. Left ventricular diastolic parameters are consistent with Grade I diastolic dysfunction (impaired relaxation). Right Ventricle: The right ventricular size is normal. No increase in right ventricular wall thickness. Right  ventricular systolic function is normal. Tricuspid regurgitation signal is inadequate for assessing PA pressure. Left Atrium: Left atrial size was normal in size. Right Atrium: Right atrial size was normal in size. Pericardium: There is no evidence of pericardial effusion. Presence of epicardial fat layer. Mitral Valve: Mobile mass on the chordae suspicious for thrombus vs calcification. May benefit from Cardiac MR in the settting of recent CVA. The mitral valve is normal in structure. There is moderate thickening of the mitral valve leaflet(s). Mild mitral valve regurgitation. No evidence of mitral valve stenosis. MV peak gradient, 3.5 mmHg. The mean mitral valve gradient is 2.0 mmHg. Tricuspid Valve: The tricuspid valve is normal in structure. Tricuspid valve regurgitation is not demonstrated. No evidence of tricuspid stenosis. Aortic Valve: The aortic valve is normal in structure. Aortic valve regurgitation is not visualized. No aortic stenosis is present. Aortic valve mean gradient measures 4.0 mmHg. Aortic valve peak gradient measures 7.5 mmHg. Aortic valve area, by VTI measures 2.51 cm. Pulmonic Valve: The pulmonic valve was normal in structure. Pulmonic valve regurgitation is not visualized. No evidence of pulmonic stenosis. Aorta: The aortic root is normal in size and structure. Venous: The inferior vena cava is normal in size with greater than 50% respiratory variability, suggesting right atrial pressure of 3 mmHg.  IAS/Shunts: No atrial level shunt detected by color flow Doppler.  LEFT VENTRICLE PLAX 2D LVIDd:         5.00 cm     Diastology LVIDs:         3.10 cm     LV e' medial:    5.61 cm/s LV PW:         0.90 cm     LV E/e' medial:  18.0 LV IVS:        1.00 cm     LV e' lateral:   10.30 cm/s LVOT diam:     2.20 cm     LV E/e' lateral: 9.8 LV SV:         68 LV SV Index:   40 LVOT Area:     3.80 cm LV IVRT:       106 msec  LV Volumes (MOD) LV vol d, MOD A2C: 56.1 ml LV vol d, MOD A4C: 83.3 ml LV vol s, MOD A2C: 23.6 ml LV vol s, MOD A4C: 47.3 ml LV SV MOD A2C:     32.5 ml LV SV MOD A4C:     83.3 ml LV SV MOD BP:      35.2 ml RIGHT VENTRICLE RV Basal diam:  3.40 cm     PULMONARY VEINS RV S prime:     13.40 cm/s  Diastolic Velocity: 52.30 cm/s TAPSE (M-mode): 1.9 cm      S/D Velocity:       0.80                             Systolic Velocity:  43.20 cm/s LEFT ATRIUM             Index        RIGHT ATRIUM           Index LA diam:        3.70 cm 2.19 cm/m   RA Area:     13.20 cm LA Vol (A2C):   52.4 ml 31.03 ml/m  RA Volume:   32.90 ml  19.48 ml/m LA Vol (A4C):   40.2 ml 23.80 ml/m LA Biplane  Vol: 46.4 ml 27.48 ml/m  AORTIC VALVE                    PULMONIC VALVE AV Area (Vmax):    2.13 cm     PV Vmax:       1.13 m/s AV Area (Vmean):   2.24 cm     PV Peak grad:  5.1 mmHg AV Area (VTI):     2.51 cm AV Vmax:           137.00 cm/s AV Vmean:          98.600 cm/s AV VTI:            0.270 m AV Peak Grad:      7.5 mmHg AV Mean Grad:      4.0 mmHg LVOT Vmax:         76.60 cm/s LVOT Vmean:        58.000 cm/s LVOT VTI:          0.178 m LVOT/AV VTI ratio: 0.66  AORTA Ao Root diam: 2.50 cm Ao Asc diam:  2.90 cm MITRAL VALVE MV Area (PHT): 3.68 cm     SHUNTS MV Area VTI:   2.38 cm     Systemic VTI:  0.18 m MV Peak grad:  3.5 mmHg     Systemic Diam: 2.20 cm MV Mean grad:  2.0 mmHg MV Vmax:       0.93 m/s MV Vmean:      65.7 cm/s MV Decel Time: 206 msec MV E velocity: 101.00 cm/s MV A velocity: 91.70 cm/s MV E/A ratio:  1.10 Kardie  Tobb DO Electronically signed by Dub Huntsman DO Signature Date/Time: 11/19/2024/9:43:41 AM    Final (Updated)    CT ANGIO HEAD NECK W WO CM Result Date: 11/19/2024 EXAM: CTA HEAD AND NECK WITHOUT AND WITH IV CONTRAST 11/19/2024 02:36:38 AM TECHNIQUE: CTA of the head and neck was performed without and with the administration of 75 mL of iohexol (OMNIPAQUE) 350 mg/mL injection. Multiplanar 2D and/or 3D reformatted images are provided for review. Automated exposure control, iterative reconstruction, and/or weight based adjustment of the mA/kV was utilized to reduce the radiation dose to as low as reasonably achievable. Stenosis of the internal carotid arteries measured using NASCET criteria. COMPARISON: Brain MRI 11/19/2024 CLINICAL HISTORY: Transient ischemic attack (TIA) FINDINGS: CTA NECK: AORTIC ARCH AND ARCH VESSELS: Aortic atherosclerosis. No dissection or arterial injury. No significant stenosis of the brachiocephalic or subclavian arteries. CERVICAL CAROTID ARTERIES: Tortuous course of the right common carotid artery. Mild atherosclerosis at the right carotid bifurcation and proximal internal carotid artery with less than 50% stenosis. Atherosclerotic calcification at the left carotid bifurcation without hemodynamically significant stenosis. No dissection or arterial injury. 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 Normal right vertebral artery. Mild atherosclerotic calcification of the left vertebral artery V4 segment. No dissection or arterial injury. LUNGS AND MEDIASTINUM: Unremarkable. SOFT TISSUES: No acute abnormality. BONES: No acute abnormality. CTA HEAD: ANTERIOR CIRCULATION: Atherosclerotic calcification of the carotid siphons with mild stenoses of the cavernous segments. 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 No significant stenosis of the anterior cerebral arteries. No significant stenosis of the  middle cerebral arteries. No aneurysm. POSTERIOR CIRCULATION: No significant stenosis of the posterior cerebral arteries. No significant stenosis of the basilar artery. No significant stenosis of the vertebral arteries. No aneurysm. OTHER: No dural venous sinus thrombosis on this non-dedicated study. IMPRESSION:  1. No emergent large vessel occlusion. 2. 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 3. 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 4. Mild atherosclerosis at the carotid bifurcations with less than 50% stenosis. Electronically signed by: Franky Stanford MD 11/19/2024 02:54 AM EST RP Workstation: [id number]   MR BRAIN WO CONTRAST Result Date: 11/19/2024 EXAM: MRI BRAIN WITHOUT CONTRAST 11/19/2024 02:07:30 AM TECHNIQUE: Multiplanar multisequence MRI of the head/brain was performed without the administration of intravenous contrast. COMPARISON: None available. CLINICAL HISTORY: Neuro deficit, acute, stroke suspected. FINDINGS: BRAIN AND VENTRICLES: Multiple small foci of abnormal diffusion restriction are present, including 1 along the interhemispheric fissure at the cingulate gyrus and another punctate lesion within the right midbrain. There is an area of hyperintensity on diffusion-weighted imaging within the left frontal corona radiata with no ADC correlate. 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 No intracranial hemorrhage. No mass. No midline shift. No hydrocephalus. The sella is unremarkable. Normal flow voids. ORBITS: No acute abnormality. SINUSES AND MASTOIDS: No acute abnormality. BONES AND SOFT TISSUES: Normal marrow signal. No acute soft tissue abnormality. IMPRESSION: 1. Multiple small foci of acute/early subacute ischemia, including along the interhemispheric fissure at the cingulate gyrus and a punctate lesion within the right midbrain.  2. 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 Electronically signed by: Franky Stanford MD 11/19/2024 02:21 AM EST RP Workstation: [id number]   CT HEAD WO CONTRAST Result Date: 11/18/2024 CLINICAL DATA:  Intermittent right leg numbness. EXAM: CT HEAD WITHOUT CONTRAST TECHNIQUE: Contiguous axial images were obtained from the base of the skull through the vertex without intravenous contrast. RADIATION DOSE REDUCTION: This exam was performed according to the departmental dose-optimization program which includes automated exposure control, adjustment of the mA and/or kV according to patient size and/or use of iterative reconstruction technique. COMPARISON:  None Available. FINDINGS: Brain: No evidence of acute infarction, hemorrhage, hydrocephalus, extra-axial collection or mass lesion/mass effect. Vascular: No hyperdense vessel or unexpected calcification. Skull: Normal. Negative for fracture or focal lesion. Sinuses/Orbits: No acute finding. Other: None. IMPRESSION: No acute intracranial pathology. Electronically Signed   By: Suzen Dials M.D.   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

## 2024-11-20 NOTE — Consult Note (Cosign Needed)
 [address]       [place] [address]             [phone]        PEBBLE BOTKIN [facility] Medical Record #[id number] Date of Birth: [birth year]  Referring: Dr. Loni MD and Dr. Sheena MD Primary Care: Gorge Ade, MD Primary Cardiologist:None  Chief Complaint:    Chief Complaint  Patient presents with   Extremity Weakness    History of Present Illness:     This is a 68 year old female with a past medical history of hypertension, hyperlipidemia, DM-type 2, tobacco abuse, asthma who presented on 11/18/2024 with lower extremity weakness (mostly right sided) and numbness that began earlier that am. She had difficulty walking and with coordination but symptoms resolved. The aforementioned symptoms then reoccurred that evening in her right lower extremity and later again resolved. She presented to [facility] ED for further work up. She denied chest pain, syncope, nausea, or problems with speech or vision.   EKG showed sinus rhythm, left anterior fascicular block, LVH, and prolonged QT. CT of the head showed no acute abnormality. MRI of the brain showed multiple small foci of acute/early subacute ischemia, including along the interhemispheric fissure at the cingulate gyrus and a punctate lesion within the right midbrain. 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 Echocardiogram done 11/19/2024 showed LVEF 45-50%, no regional wall motion abnormality, mobile mass on chordae of MV  suspicious for thrombus vs calcification. MV is normal in structure, mild MR, no MS, or other significant valvular abnormalities.   Cardiac MRI showed  the mitral valve mass seen on echocardiography is unable to be well visualized on MRI due to highly mobile mass and small size. Results did show mildly reduced LV systolic function, moderate MR, a small focus of mid-apical inferolateral wall delayed enhancement, likely subendocardial-midmyocardial, most suggestive of a prior small infarct, possibly embolic. Alternatively, may represent small focus of prior myocarditis. Neurology evaluated patient. It was ultimately recommended patient be placed on Plavix  and baby ecasa with aggressive risk modification. A TEE was done earlier today but results are not available at this time. Of note, I personally spoke with Dr. 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 Cardiothoracic consultation was requested.  PT has been working with patient.  Per patient, she has been ambulating without any issues of late. She tells me that she has regained most of her strength, she is able to move all her extremities without issues.   Current Activity/ Functional Status: Patient is independent with mobility/ambulation, transfers, ADL's, IADL's.   Zubrod Score: At the time of surgery this patient's most appropriate activity status/level should be described as: []     0    Normal activity, no symptoms [x]     1    Restricted in physical strenuous activity but ambulatory, able to do out light work []     2    Ambulatory and capable of self care, unable to do work activities, up and about more than 50%  Of the time                            []   3    Only limited self care, in bed greater than 50% of waking hours []     4    Completely disabled, no self care, confined to bed or chair []     5    Moribund  Past Medical History:  Diagnosis Date   Asthma    Hyperlipemia    Hypertension     Past Surgical History:  Procedure Laterality Date   ABDOMINAL HYSTERECTOMY     CESAREAN SECTION      UTERINE FIBROID SURGERY      Social History   Tobacco Use  Smoking Status Every Day   Types: Cigarettes  Smokeless Tobacco Never    Social History   Substance and Sexual Activity  Alcohol Use Yes    Allergies: No Known Allergies  Current Facility-Administered Medications  Medication Dose Route Frequency Provider Last Rate Last Admin   acetaminophen  (TYLENOL ) tablet 650 mg  650 mg Oral Q4H PRN Foust, Katy L, NP       Or   acetaminophen  (TYLENOL ) 160 MG/5ML solution 650 mg  650 mg Per Tube Q4H PRN Foust, Katy L, NP       Or   acetaminophen  (TYLENOL ) suppository 650 mg  650 mg Rectal Q4H PRN Foust, Katy L, NP       albuterol (PROVENTIL) (2.5 MG/3ML) 0.083% nebulizer solution 2.5 mg  2.5 mg Nebulization Q4H PRN Foust, Katy L, NP       aspirin EC tablet 81 mg  81 mg Oral Daily Lindzen, Eric, MD   81 mg at 11/20/24 0834   atorvastatin (LIPITOR) tablet 80 mg  80 mg Oral Daily Lera Golas B, DO   80 mg at 11/20/24 0835   clopidogrel (PLAVIX) tablet 75 mg  75 mg Oral Daily Reome, Earle J, RPH   75 mg at 11/20/24 0835   cyanocobalamin (VITAMIN B12) tablet 1,000 mcg  1,000 mcg Oral Daily Singh, Prashant K, MD   1,000 mcg at 11/20/24 0835   enoxaparin (LOVENOX) injection 40 mg  40 mg Subcutaneous Q24H Foust, Katy L, NP   40 mg at 11/20/24 0834   insulin aspart (novoLOG) injection 0-5 Units  0-5 Units Subcutaneous QHS Foust, Katy L, NP       insulin aspart (novoLOG) injection 0-9 Units  0-9 Units Subcutaneous TID WC Foust, Katy L, NP       melatonin tablet 5 mg  5 mg Oral QHS PRN Foust, Katy L, NP       nicotine (NICODERM CQ - dosed in mg/24 hours) patch 14 mg  14 mg Transdermal Daily Foust, Katy L, NP   14 mg at 11/20/24 9163   nicotine polacrilex (NICORETTE) gum 2 mg  2 mg Oral PRN Foust, Katy L, NP       pantoprazole (PROTONIX) EC tablet 40 mg  40 mg Oral Daily Foust, Katy L, NP   40 mg at 11/20/24 9164   pneumococcal 20-valent conjugate vaccine (PREVNAR 20) injection 0.5 mL  0.5 mL  Intramuscular Tomorrow-1000 Foust, Katy L, NP       senna-docusate (Senokot-S) tablet 1 tablet  1 tablet Oral QHS PRN Foust, Katy L, NP        Medications Prior to Admission  Medication Sig Dispense Refill Last Dose/Taking   cyanocobalamin 1000 MCG tablet Take 1,000 mcg by mouth in the morning.   11/18/2024 at  4:00 AM   triamterene-hydrochlorothiazide (MAXZIDE-25) 37.5-25 MG per tablet Take 1 tablet by mouth in the morning.   11/18/2024  Family History  Problem Relation Age of Onset   Diabetes Father    Hypertension Father    Cancer Mother    Heart failure Mother    Hypertension Mother    Review of Systems:    Cardiac Review of Systems: Y or  [  N  ]= no  Chest Pain [  N  ]  SOB [ N  ] Pedal Edema [   N]    Palpitations [  N] Syncope  [ N ]     General Review of Systems: [Y] = yes [N  ]=no Constitional: nausea [ N ];  fever [ N ]; or                                                         Dental: Last Dentist visit: Unknown. Has upper/lower dentures  Eye : Amaurosis fugax[ N ]; Resp: cough [has at times-tobacco use];   hemoptysis[ N ];  GI:   vomiting[  N];  melena[ N ];  hematochezia DISCORDIA.DIESEL  ];  GU: khematuria[ N ];                Skin: rash, swelling[ N ];,   Heme/Lymph:  anemia[Y  ];  Neuro: difficulty walking[ N-at time of being seen ];  Endocrine: diabetes[Y  ];                 Physical Exam: BP (!) 143/73   Pulse 69   Temp 97.9 F (36.6 C) (Temporal)   Resp 14   SpO2 97%    General appearance: alert, cooperative, and no distress Head: Normocephalic, without obvious abnormality, atraumatic Neck: supple, symmetrical, trachea midline Resp: clear to auscultation bilaterally Cardio: RRR GI: soft, non-tender; bowel sounds normal; no masses,  no organomegaly Extremities: No LE edema. Feet warm bilaterally Neurologic: Grossly normal  Diagnostic Studies & Laboratory data:     Recent Radiology Findings:   EP STUDY Result Date: 11/20/2024 See surgical note for  result.  MR CARDIAC MORPHOLOGY W WO CONTRAST Result Date: 11/19/2024 CLINICAL DATA:  Mitral valve mass COMPARISON: Echo 11/19/24 EXAM: MR CARDIA MORPHOLOGY WITHOUT AND WITH CONTRAST; MR CARDIAC VELOCITY FLOW MAPPING TECHNIQUE: The patient was scanned on a 1.5 Tesla Siemens magnet. A dedicated cardiac coil was used. Functional imaging was done using TrueFisp sequences. 2,3, and 4 chamber views were done to assess for RWMA's. Modified Simpson's rule using a short axis stack was used to calculate an ejection fraction on a dedicated work [employer]. The patient received 10mL GADAVIST GADOBUTROL 1 MMOL/ML IV SOLN. After 10 minutes inversion recovery sequences were used to assess for infiltration and scar tissue. Phase contrast velocity encoded images obtained x 2. This examination is tailored for evaluation cardiac anatomy and function and provides very limited assessment of noncardiac structures, which are accordingly not evaluated during interpretation. If there is clinical concern for extracardiac pathology, further evaluation with CT imaging should be considered. FINDINGS: LEFT VENTRICLE: Left ventricular chamber size: Mildly dilated by indexed volumes. Left ventricular wall thickness: Normal. Left ventricular systolic function: Mildly decreased LVEF = 41%, by biplane volumes 45%. Prominent apical trabeculations without definite evidence of noncompaction cardiomyopathy. Global hypokinesis with subtle mid-apical lateral wall moderate hypokinesis. No myocardial edema, T2 = 48 msec Normal first pass perfusion. There is post contrast  delayed myocardial enhancement: Small focus of mid-apical lateral wall LGE, likely subendocardial-midmyocardial, most suggestive of a prior small infarct, possibly embolic. Alternatively, may represent small focus of prior myocarditis. Normal T1 myocardial nulling kinetics suggest against a diagnosis of cardiac amyloidosis. ECV = 30% RIGHT VENTRICLE: Normal right  ventricular chamber size. Normal right ventricular wall thickness. Mildly reduced right ventricular systolic function. RVEF = 49% There are no regional wall motion abnormalities. No post contrast delayed myocardial enhancement. ATRIA: Normal biatrial size with redundant atrial septum. PERICARDIUM: Normal pericardium.  Trivial pericardial effusion. OTHER: No significant extracardiac findings. MEASUREMENTS: Qp/Qs: 1.01 VALVES: Aortic valve regurgitation: Mild, regurgitant fraction 6% Pulmonary valve regurgitation: Trivial, regurgitant fraction 1% Mitral valve regurgitation: Moderate, regurgitant fraction 30% Tricuspid valve regurgitation: Mild-moderate, regurgitant fraction 20% Left ventricle: LV female LV EF: 41 % (Normal 52-79%) Absolute volumes: LV EDV: (Normal 78-167 mL) LV ESV: (Normal 21-64 mL) LV SV: 70mL (Normal 52-114 mL) CO: 5.2L/min (Normal 2.7-6.3 L/min) Indexed volumes: CI: 3.0L/min/sq-m (Normal 1.9-3.9 L/min/sq-m) LV EDV: 7mL/sq-m (Normal 50-96 mL/sq-m) LV ESV: 89mL/sq-m (Normal 10-40 mL/sq-m) LV SV: 46mL/sq-m (Normal 33-64 mL/sq-m) Right ventricle: RV female RV EF: 49% (normal 52-80%) Absolute volumes: RV EDV: (Normal 79-175 mL) RV ESV: 62mL (Normal 13-75 mL) RV SV: 59mL (Normal 56-110 mL) CO: 4.4L/min (Normal 2.7-6 L/min) Indexed volumes: CI: 2.5L/min/sq-m (Normal 1.8-3.8 L/min/sq-m) RV EDV: 69mL/sq-m (Normal 51-97 mL/sq-m) RV ESV: 46mL/sq-m (Normal 9-42 mL/sq-m) RV SV: 27mL/sq-m (Normal 35-61 mL/sq-m) IMPRESSION: 1. 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 Unable to characterize. Consider TEE. 2. Mildly reduced LV systolic function with mild LV dilation by indexed volume. LVEF 41%, visually 45%, biplane volume calculated 45%. There is a small focus of mid-apical inferolateral wall delayed enhancement, likely subendocardial-midmyocardial, most suggestive of a prior small infarct, possibly embolic.  Alternatively, may represent small focus of prior myocarditis. There is an associated mid-apical lateral wall regional wall motion abnormality. 3. Normal right ventricular chamber size with mildly reduced RV systolic function, RVEF 49%. 4. Moderate mitral valve regurgitation, mild-moderate tricuspid valve regurgitation. Electronically Signed   By: Soyla Merck M.D.   On: 11/19/2024 22:35   MR CARDIAC VELOCITY FLOW MAP Result Date: 11/19/2024 CLINICAL DATA:  Mitral valve mass COMPARISON: Echo 11/19/24 EXAM: MR CARDIA MORPHOLOGY WITHOUT AND WITH CONTRAST; MR CARDIAC VELOCITY FLOW MAPPING TECHNIQUE: The patient was scanned on a 1.5 Tesla Siemens magnet. A dedicated cardiac coil was used. Functional imaging was done using TrueFisp sequences. 2,3, and 4 chamber views were done to assess for RWMA's. Modified Simpson's rule using a short axis stack was used to calculate an ejection fraction on a dedicated work [employer]. The patient received 10mL GADAVIST GADOBUTROL 1 MMOL/ML IV SOLN. After 10 minutes inversion recovery sequences were used to assess for infiltration and scar tissue. Phase contrast velocity encoded images obtained x 2. This examination is tailored for evaluation cardiac anatomy and function and provides very limited assessment of noncardiac structures, which are accordingly not evaluated during interpretation. If there is clinical concern for extracardiac pathology, further evaluation with CT imaging should be considered. FINDINGS: LEFT VENTRICLE: Left ventricular chamber size: Mildly dilated by indexed volumes. Left ventricular wall thickness: Normal. Left ventricular systolic function: Mildly decreased LVEF = 41%, by biplane volumes 45%. Prominent apical trabeculations without definite evidence of noncompaction cardiomyopathy. Global hypokinesis with subtle mid-apical lateral wall moderate hypokinesis. No myocardial edema, T2 = 48 msec Normal  first pass perfusion. There is post  contrast delayed myocardial enhancement: Small focus of mid-apical lateral wall LGE, likely subendocardial-midmyocardial, most suggestive of a prior small infarct, possibly embolic. Alternatively, may represent small focus of prior myocarditis. Normal T1 myocardial nulling kinetics suggest against a diagnosis of cardiac amyloidosis. ECV = 30% RIGHT VENTRICLE: Normal right ventricular chamber size. Normal right ventricular wall thickness. Mildly reduced right ventricular systolic function. RVEF = 49% There are no regional wall motion abnormalities. No post contrast delayed myocardial enhancement. ATRIA: Normal biatrial size with redundant atrial septum. PERICARDIUM: Normal pericardium.  Trivial pericardial effusion. OTHER: No significant extracardiac findings. MEASUREMENTS: Qp/Qs: 1.01 VALVES: Aortic valve regurgitation: Mild, regurgitant fraction 6% Pulmonary valve regurgitation: Trivial, regurgitant fraction 1% Mitral valve regurgitation: Moderate, regurgitant fraction 30% Tricuspid valve regurgitation: Mild-moderate, regurgitant fraction 20% Left ventricle: LV female LV EF: 41 % (Normal 52-79%) Absolute volumes: LV EDV: (Normal 78-167 mL) LV ESV: (Normal 21-64 mL) LV SV: 70mL (Normal 52-114 mL) CO: 5.2L/min (Normal 2.7-6.3 L/min) Indexed volumes: CI: 3.0L/min/sq-m (Normal 1.9-3.9 L/min/sq-m) LV EDV: 15mL/sq-m (Normal 50-96 mL/sq-m) LV ESV: 81mL/sq-m (Normal 10-40 mL/sq-m) LV SV: 4mL/sq-m (Normal 33-64 mL/sq-m) Right ventricle: RV female RV EF: 49% (normal 52-80%) Absolute volumes: RV EDV: (Normal 79-175 mL) RV ESV: 62mL (Normal 13-75 mL) RV SV: 59mL (Normal 56-110 mL) CO: 4.4L/min (Normal 2.7-6 L/min) Indexed volumes: CI: 2.5L/min/sq-m (Normal 1.8-3.8 L/min/sq-m) RV EDV: 40mL/sq-m (Normal 51-97 mL/sq-m) RV ESV: 81mL/sq-m (Normal 9-42 mL/sq-m) RV SV: 58mL/sq-m (Normal 35-61 mL/sq-m) IMPRESSION: 1. The mitral valve mass seen on echocardiography is unable to be well visualized on MRI due to highly  mobile mass and small size. Unable to characterize. Consider TEE. 2. Mildly reduced LV systolic function with mild LV dilation by indexed volume. LVEF 41%, visually 45%, biplane volume calculated 45%. There is a small focus of mid-apical inferolateral wall delayed enhancement, likely subendocardial-midmyocardial, most suggestive of a prior small infarct, possibly embolic. Alternatively, may represent small focus of prior myocarditis. There is an associated mid-apical lateral wall regional wall motion abnormality. 3. Normal right ventricular chamber size with mildly reduced RV systolic function, RVEF 49%. 4. Moderate mitral valve regurgitation, mild-moderate tricuspid valve regurgitation. Electronically Signed   By: Soyla Merck M.D.   On: 11/19/2024 22:35   MR CARDIAC VELOCITY FLOW MAP Result Date: 11/19/2024 CLINICAL DATA:  Mitral valve mass COMPARISON: Echo 11/19/24 EXAM: MR CARDIA MORPHOLOGY WITHOUT AND WITH CONTRAST; MR CARDIAC VELOCITY FLOW MAPPING TECHNIQUE: The patient was scanned on a 1.5 Tesla Siemens magnet. A dedicated cardiac coil was used. Functional imaging was done using TrueFisp sequences. 2,3, and 4 chamber views were done to assess for RWMA's. Modified Simpson's rule using a short axis stack was used to calculate an ejection fraction on a dedicated work [employer]. The patient received 10mL GADAVIST GADOBUTROL 1 MMOL/ML IV SOLN. After 10 minutes inversion recovery sequences were used to assess for infiltration and scar tissue. Phase contrast velocity encoded images obtained x 2. This examination is tailored for evaluation cardiac anatomy and function and provides very limited assessment of noncardiac structures, which are accordingly not evaluated during interpretation. If there is clinical concern for extracardiac pathology, further evaluation with CT imaging should be considered. FINDINGS: LEFT VENTRICLE: Left ventricular chamber size: Mildly dilated by indexed volumes.  Left ventricular wall thickness: Normal. Left ventricular systolic function: Mildly decreased LVEF = 41%, by biplane volumes 45%. Prominent apical trabeculations without definite evidence of noncompaction cardiomyopathy. Global hypokinesis with subtle mid-apical lateral wall moderate hypokinesis.  No myocardial edema, T2 = 48 msec Normal first pass perfusion. There is post contrast delayed myocardial enhancement: Small focus of mid-apical lateral wall LGE, likely subendocardial-midmyocardial, most suggestive of a prior small infarct, possibly embolic. Alternatively, may represent small focus of prior myocarditis. Normal T1 myocardial nulling kinetics suggest against a diagnosis of cardiac amyloidosis. ECV = 30% RIGHT VENTRICLE: Normal right ventricular chamber size. Normal right ventricular wall thickness. Mildly reduced right ventricular systolic function. RVEF = 49% There are no regional wall motion abnormalities. No post contrast delayed myocardial enhancement. ATRIA: Normal biatrial size with redundant atrial septum. PERICARDIUM: Normal pericardium.  Trivial pericardial effusion. OTHER: No significant extracardiac findings. MEASUREMENTS: Qp/Qs: 1.01 VALVES: Aortic valve regurgitation: Mild, regurgitant fraction 6% Pulmonary valve regurgitation: Trivial, regurgitant fraction 1% Mitral valve regurgitation: Moderate, regurgitant fraction 30% Tricuspid valve regurgitation: Mild-moderate, regurgitant fraction 20% Left ventricle: LV female LV EF: 41 % (Normal 52-79%) Absolute volumes: LV EDV: (Normal 78-167 mL) LV ESV: (Normal 21-64 mL) LV SV: 70mL (Normal 52-114 mL) CO: 5.2L/min (Normal 2.7-6.3 L/min) Indexed volumes: CI: 3.0L/min/sq-m (Normal 1.9-3.9 L/min/sq-m) LV EDV: 29mL/sq-m (Normal 50-96 mL/sq-m) LV ESV: 35mL/sq-m (Normal 10-40 mL/sq-m) LV SV: 34mL/sq-m (Normal 33-64 mL/sq-m) Right ventricle: RV female RV EF: 49% (normal 52-80%) Absolute volumes: RV EDV: (Normal 79-175 mL) RV ESV: 62mL (Normal  13-75 mL) RV SV: 59mL (Normal 56-110 mL) CO: 4.4L/min (Normal 2.7-6 L/min) Indexed volumes: CI: 2.5L/min/sq-m (Normal 1.8-3.8 L/min/sq-m) RV EDV: 6mL/sq-m (Normal 51-97 mL/sq-m) RV ESV: 32mL/sq-m (Normal 9-42 mL/sq-m) RV SV: 88mL/sq-m (Normal 35-61 mL/sq-m) IMPRESSION: 1. 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 Unable to characterize. Consider TEE. 2. Mildly reduced LV systolic function with mild LV dilation by indexed volume. LVEF 41%, visually 45%, biplane volume calculated 45%. There is a small focus of mid-apical inferolateral wall delayed enhancement, likely subendocardial-midmyocardial, most suggestive of a prior small infarct, possibly embolic. Alternatively, may represent small focus of prior myocarditis. There is an associated mid-apical lateral wall regional wall motion abnormality. 3. Normal right ventricular chamber size with mildly reduced RV systolic function, RVEF 49%. 4. Moderate mitral valve regurgitation, mild-moderate tricuspid valve regurgitation. Electronically Signed   By: Soyla Merck M.D.   On: 11/19/2024 22:35   ECHOCARDIOGRAM COMPLETE Result Date: 11/19/2024    ECHOCARDIOGRAM REPORT   Patient Name:   HANNIA MATCHETT Date of Exam: 11/19/2024 Medical Rec #:  [id number]      Height:       62.0 in Accession #:    [id number]     Weight:       149.4 lb Date of Birth:  January 06, 1956      BSA:          1.689 m Patient Age:    68 years       BP:           135/72 mmHg Patient Gender: F              HR:           70 bpm. Exam Location:  Inpatient Procedure: 2D Echo (Both Spectral and Color Flow Doppler were utilized during            procedure). Indications:     Stroke  History:         Patient has no prior history of Echocardiogram examinations.  Stroke.  Sonographer:     Norleen Amour Referring Phys:  [id number] KATY L FOUST Diagnosing Phys: Kardie Tobb DO IMPRESSIONS  1. Left ventricular ejection  fraction, by estimation, is 45 to 50%. The left ventricle has mildly decreased function. The left ventricle has no regional wall motion abnormalities. Left ventricular diastolic parameters are consistent with Grade I diastolic dysfunction (impaired relaxation).  2. Right ventricular systolic function is normal. The right ventricular size is normal. Tricuspid regurgitation signal is inadequate for assessing PA pressure.  3. Mobile mass on the chordae suspicious for thrombus vs calcification. May benefit from Cardiac MR in the settting of recent CVA. SABRA The mitral valve is normal in structure. Mild mitral valve regurgitation. No evidence of mitral stenosis.  4. The aortic valve is normal in structure. Aortic valve regurgitation is not visualized. No aortic stenosis is present.  5. The inferior vena cava is normal in size with greater than 50% respiratory variability, suggesting right atrial pressure of 3 mmHg. FINDINGS  Left Ventricle: Left ventricular ejection fraction, by estimation, is 45 to 50%. The left ventricle has mildly decreased function. The left ventricle has no regional wall motion abnormalities. The left ventricular internal cavity size was normal in size. There is no left ventricular hypertrophy. Left ventricular diastolic parameters are consistent with Grade I diastolic dysfunction (impaired relaxation). Right Ventricle: The right ventricular size is normal. No increase in right ventricular wall thickness. Right ventricular systolic function is normal. Tricuspid regurgitation signal is inadequate for assessing PA pressure. Left Atrium: Left atrial size was normal in size. Right Atrium: Right atrial size was normal in size. Pericardium: There is no evidence of pericardial effusion. Presence of epicardial fat layer. Mitral Valve: Mobile mass on the chordae suspicious for thrombus vs calcification. May benefit from Cardiac MR in the settting of recent CVA. The mitral valve is normal in structure. There is  moderate thickening of the mitral valve leaflet(s). Mild mitral valve regurgitation. No evidence of mitral valve stenosis. MV peak gradient, 3.5 mmHg. The mean mitral valve gradient is 2.0 mmHg. Tricuspid Valve: The tricuspid valve is normal in structure. Tricuspid valve regurgitation is not demonstrated. No evidence of tricuspid stenosis. Aortic Valve: The aortic valve is normal in structure. Aortic valve regurgitation is not visualized. No aortic stenosis is present. Aortic valve mean gradient measures 4.0 mmHg. Aortic valve peak gradient measures 7.5 mmHg. Aortic valve area, by VTI measures 2.51 cm. Pulmonic Valve: The pulmonic valve was normal in structure. Pulmonic valve regurgitation is not visualized. No evidence of pulmonic stenosis. Aorta: The aortic root is normal in size and structure. Venous: The inferior vena cava is normal in size with greater than 50% respiratory variability, suggesting right atrial pressure of 3 mmHg. IAS/Shunts: No atrial level shunt detected by color flow Doppler.  LEFT VENTRICLE PLAX 2D LVIDd:         5.00 cm     Diastology LVIDs:         3.10 cm     LV e' medial:    5.61 cm/s LV PW:         0.90 cm     LV E/e' medial:  18.0 LV IVS:        1.00 cm     LV e' lateral:   10.30 cm/s LVOT diam:     2.20 cm     LV E/e' lateral: 9.8 LV SV:         68 LV SV Index:   40 LVOT Area:  3.80 cm LV IVRT:       106 msec  LV Volumes (MOD) LV vol d, MOD A2C: 56.1 ml LV vol d, MOD A4C: 83.3 ml LV vol s, MOD A2C: 23.6 ml LV vol s, MOD A4C: 47.3 ml LV SV MOD A2C:     32.5 ml LV SV MOD A4C:     83.3 ml LV SV MOD BP:      35.2 ml RIGHT VENTRICLE RV Basal diam:  3.40 cm     PULMONARY VEINS RV S prime:     13.40 cm/s  Diastolic Velocity: 52.30 cm/s TAPSE (M-mode): 1.9 cm      S/D Velocity:       0.80                             Systolic Velocity:  43.20 cm/s LEFT ATRIUM             Index        RIGHT ATRIUM           Index LA diam:        3.70 cm 2.19 cm/m   RA Area:     13.20 cm LA Vol (A2C):    52.4 ml 31.03 ml/m  RA Volume:   32.90 ml  19.48 ml/m LA Vol (A4C):   40.2 ml 23.80 ml/m LA Biplane Vol: 46.4 ml 27.48 ml/m  AORTIC VALVE                    PULMONIC VALVE AV Area (Vmax):    2.13 cm     PV Vmax:       1.13 m/s AV Area (Vmean):   2.24 cm     PV Peak grad:  5.1 mmHg AV Area (VTI):     2.51 cm AV Vmax:           137.00 cm/s AV Vmean:          98.600 cm/s AV VTI:            0.270 m AV Peak Grad:      7.5 mmHg AV Mean Grad:      4.0 mmHg LVOT Vmax:         76.60 cm/s LVOT Vmean:        58.000 cm/s LVOT VTI:          0.178 m LVOT/AV VTI ratio: 0.66  AORTA Ao Root diam: 2.50 cm Ao Asc diam:  2.90 cm MITRAL VALVE MV Area (PHT): 3.68 cm     SHUNTS MV Area VTI:   2.38 cm     Systemic VTI:  0.18 m MV Peak grad:  3.5 mmHg     Systemic Diam: 2.20 cm MV Mean grad:  2.0 mmHg MV Vmax:       0.93 m/s MV Vmean:      65.7 cm/s MV Decel Time: 206 msec MV E velocity: 101.00 cm/s MV A velocity: 91.70 cm/s MV E/A ratio:  1.10 Kardie Tobb DO Electronically signed by Dub Huntsman DO Signature Date/Time: 11/19/2024/9:43:41 AM    Final (Updated)    CT ANGIO HEAD NECK W WO CM Result Date: 11/19/2024 EXAM: CTA HEAD AND NECK WITHOUT AND WITH IV CONTRAST 11/19/2024 02:36:38 AM TECHNIQUE: CTA of the head and neck was performed without and with the administration of 75 mL of iohexol (OMNIPAQUE) 350 mg/mL injection. Multiplanar 2D and/or 3D reformatted images are provided for review. Automated  exposure control, iterative reconstruction, and/or weight based adjustment of the mA/kV was utilized to reduce the radiation dose to as low as reasonably achievable. Stenosis of the internal carotid arteries measured using NASCET criteria. COMPARISON: Brain MRI 11/19/2024 CLINICAL HISTORY: Transient ischemic attack (TIA) FINDINGS: CTA NECK: AORTIC ARCH AND ARCH VESSELS: Aortic atherosclerosis. No dissection or arterial injury. No significant stenosis of the brachiocephalic or subclavian arteries. CERVICAL CAROTID ARTERIES: Tortuous  course of the right common carotid artery. Mild atherosclerosis at the right carotid bifurcation and proximal internal carotid artery with less than 50% stenosis. Atherosclerotic calcification at the left carotid bifurcation without hemodynamically significant stenosis. No dissection or arterial injury. 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 Normal right vertebral artery. Mild atherosclerotic calcification of the left vertebral artery V4 segment. No dissection or arterial injury. LUNGS AND MEDIASTINUM: Unremarkable. SOFT TISSUES: No acute abnormality. BONES: No acute abnormality. CTA HEAD: ANTERIOR CIRCULATION: Atherosclerotic calcification of the carotid siphons with mild stenoses of the cavernous segments. 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 No significant stenosis of the anterior cerebral arteries. No significant stenosis of the middle cerebral arteries. No aneurysm. POSTERIOR CIRCULATION: No significant stenosis of the posterior cerebral arteries. No significant stenosis of the basilar artery. No significant stenosis of the vertebral arteries. No aneurysm. OTHER: No dural venous sinus thrombosis on this non-dedicated study. IMPRESSION: 1. No emergent large vessel occlusion. 2. 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 3. 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 4. Mild atherosclerosis at the carotid bifurcations with less than 50% stenosis. Electronically signed by: Franky Stanford MD 11/19/2024 02:54 AM EST RP Workstation: [id number]   MR BRAIN WO CONTRAST Result Date: 11/19/2024 EXAM: MRI BRAIN WITHOUT CONTRAST 11/19/2024 02:07:30 AM TECHNIQUE: Multiplanar multisequence MRI of the head/brain was performed without the administration of intravenous contrast.  COMPARISON: None available. CLINICAL HISTORY: Neuro deficit, acute, stroke suspected. FINDINGS: BRAIN AND VENTRICLES: Multiple small foci of abnormal diffusion restriction are present, including 1 along the interhemispheric fissure at the cingulate gyrus and another punctate lesion within the right midbrain. There is an area of hyperintensity on diffusion-weighted imaging within the left frontal corona radiata with no ADC correlate. 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 No intracranial hemorrhage. No mass. No midline shift. No hydrocephalus. The sella is unremarkable. Normal flow voids. ORBITS: No acute abnormality. SINUSES AND MASTOIDS: No acute abnormality. BONES AND SOFT TISSUES: Normal marrow signal. No acute soft tissue abnormality. IMPRESSION: 1. Multiple small foci of acute/early subacute ischemia, including along the interhemispheric fissure at the cingulate gyrus and a punctate lesion within the right midbrain. 2. 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 Electronically signed by: Franky Stanford MD 11/19/2024 02:21 AM EST RP Workstation: [id number]   CT HEAD WO CONTRAST Result Date: 11/18/2024 CLINICAL DATA:  Intermittent right leg numbness. EXAM: CT HEAD WITHOUT CONTRAST TECHNIQUE: Contiguous axial images were obtained from the base of the skull through the vertex without intravenous contrast. RADIATION DOSE REDUCTION: This exam was performed according to the departmental dose-optimization program which includes automated exposure control, adjustment of the mA and/or kV according to patient size and/or use of iterative reconstruction technique. COMPARISON:  None Available. FINDINGS: Brain: No evidence of acute infarction, hemorrhage, hydrocephalus, extra-axial collection or mass lesion/mass effect. Vascular: No hyperdense vessel  or unexpected calcification. Skull: Normal. Negative for fracture or focal  lesion. Sinuses/Orbits: No acute finding. Other: None. IMPRESSION: No acute intracranial pathology. Electronically Signed   By: Suzen Dials M.D.   On: 11/18/2024 22:51     I have independently reviewed the above radiologic studies and discussed with the patient   Recent Lab Findings: Lab Results  Component Value Date   WBC 8.1 11/20/2024   HGB 11.4 (L) 11/20/2024   HCT 35.8 (L) 11/20/2024   PLT 349 11/20/2024   GLUCOSE 100 (H) 11/20/2024   CHOL 194 11/19/2024   TRIG 145 11/19/2024   HDL 54 11/19/2024   LDLCALC 111 (H) 11/19/2024   ALT 21 11/18/2024   AST 23 11/18/2024   NA 142 11/20/2024   K 3.9 11/20/2024   CL 101 11/20/2024   CREATININE 1.02 (H) 11/20/2024   BUN 20 11/20/2024   CO2 30 11/20/2024   INR 1.0 11/18/2024   HGBA1C 6.3 (H) 11/19/2024   Assessment / Plan:   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 Dr. Shyrl to review all studies and provide his recommendation as to whether surgery is needed or not on this admission. 2. Acute ischemic infarct-on DAPT (Plavix 75 mg daily and ec asa 81 mg daily. Per neurology, DAPT for 21 weeks followed by ec asa only) 3. History of hypertension-she appears to have been on Maxzide piror to admission 4. History of hyperlipidemia-on Atorvastatin 80 mg daily 5. History of diabetes mellitus (type 2)-HGA1C 6.3 6. Tobacco abuse-encouraged cessation. She is on Nicotine patch 7. Severe stenosis of left vertebral artery  I  spent 25 minutes counseling the patient face to face.   Itai Barbian PA-C 11/20/2024 3:17 PM

## 2024-11-20 NOTE — Progress Notes (Signed)
 PROGRESS NOTE     Patient Demographics:    Kaylee Campbell, is a 68 y.o. female, DOB - [birth year], FMW:[id number]  Outpatient Primary MD for the patient is Gorge Ade, MD    LOS - 0  Admit date - 11/18/2024    Chief Complaint  Patient presents with   Extremity Weakness       Brief Narrative (HPI from H&P)    68 y.o. year old female with past medical history of hypertension, hyperlipidemia, type 2 diabetes mellitus not on outpatient medications, asthma, and tobacco use disorder.  She presented to [facility], ED with bilateral lower extremity numbness and weakness that began yesterday morning when she woke up around 9 AM.  She noted difficulty walking and coordination challenges due to this however it resolved and she went on about her day.  She notes that the symptoms recurred in her (R)LE only around 7 PM and again resolved.  However the recurrence of symptoms prompted her to present to the ED. the ER workup consistent with stroke and admitted to the hospital   Subjective:    Kaylee Campbell today has, No headache, No chest pain, No abdominal pain - No Nausea, No new weakness tingling or numbness, no shortness of breath   Assessment  & Plan :   Acute CVA with MRI positive for -  Multiple small foci of acute/early subacute ischemia, including along the interhemispheric fissure at the cingulate gyrus and a punctate lesion within the right midbrain. 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  Neuro team following, full stroke workup being done, currently on DAPT along with statin, for now plan is for DAPT for 3 weeks thereafter aspirin alone, was  found no antiplatelets or statin at home, A1c suggest mild DM type II for which diet control and PCP monitoring will be suggested, LDL was above goal, echocardiogram suggestive of possible mass on mitral valve, cardiac MRI could not confirm it, now due for TEE on 11/20/2024.  Continue to monitor  ##Severe stenosis of the left vertebral artery, Severe stenosis  of left ICA, Carotid Artery Atherosclerosis  - Outpatient vascular follow-up, DAPT and statin as above.  Hypertension.  Permissive hypertension for #1 above  Dyslipidemia.  Placed on statin.  History of asthma.  Stable.    Ongoing history of smoking.  Counseled, NicoDerm patch.      Condition - Fair  Family Communication  : Nephew bedside 11/20/2024  Code Status :  Full  Consults  :  Neuro, Cards  PUD Prophylaxis :    Procedures  :            Disposition Plan  :    Status is: Observation  DVT Prophylaxis  :    enoxaparin (LOVENOX) injection 40 mg Start: 11/19/24 0800    Lab Results  Component Value Date   PLT 349 11/20/2024    Diet :  Diet Order             Diet NPO time specified Except for: Sips with Meds  Diet effective midnight                    Inpatient Medications  Scheduled Meds:  aspirin EC  81 mg Oral Daily   atorvastatin  80 mg Oral Daily   clopidogrel  75 mg Oral Daily   cyanocobalamin  1,000 mcg Oral Daily   enoxaparin (LOVENOX) injection  40 mg Subcutaneous Q24H   insulin aspart  0-5 Units Subcutaneous QHS   insulin aspart  0-9 Units Subcutaneous TID WC   nicotine  14 mg Transdermal Daily   pantoprazole  40 mg Oral Daily   pneumococcal 20-valent conjugate vaccine  0.5 mL Intramuscular Tomorrow-1000   Continuous Infusions: PRN Meds:.acetaminophen  **OR** acetaminophen  (TYLENOL ) oral liquid 160 mg/5 mL **OR** acetaminophen , albuterol, melatonin, nicotine polacrilex, senna-docusate  Antibiotics  :    Anti-infectives (From admission, onward)    None         Objective:    Vitals:   11/19/24 2048 11/20/24 0031 11/20/24 0425 11/20/24 0808  BP: 135/84 (!) 140/75 (!) 148/82 130/77  Pulse: 85 68 73 69  Resp:      Temp: (!) 97.5 F (36.4 C) 97.8 F (36.6 C) (!) 97 F (36.1 C) 97.6 F (36.4 C)  TempSrc: Oral Oral Axillary Oral  SpO2:        Wt Readings from Last 3 Encounters:  06/20/18 67.8 kg  03/07/18 132.5 kg    No intake or output data in the 24 hours ending 11/20/24 9061   Physical Exam  Awake Alert, No new F.N deficits, Normal affect [place].AT,PERRAL Supple Neck, No JVD,   Symmetrical Chest wall movement, Good air movement bilaterally, CTAB RRR,No Gallops,Rubs or new Murmurs,  +ve B.Sounds, Abd Soft, No tenderness,   No Cyanosis, Clubbing or edema       Data Review:    Recent Labs  Lab 11/18/24 2122 11/19/24 0814 11/20/24 0423  WBC 9.8 8.5 8.1  HGB 12.0 11.0* 11.4*  HCT 38.3 34.4* 35.8*  PLT 395 317 349  MCV 82.9 82.1 81.9  MCH 26.0 26.3 26.1  MCHC 31.3 32.0 31.8  RDW 15.9* 16.1* 16.0*    Recent Labs  Lab 11/18/24 2122 11/19/24 0814 11/19/24 1203 11/20/24 0423  NA 142 138  --  142  K 3.5 3.7  --  3.9  CL 102 103  --  101  CO2 30 26  --  30  ANIONGAP 10 9  --  11  GLUCOSE 104* 99  --  100*  BUN 21  22  --  20  CREATININE 1.09* 0.89  --  1.02*  AST 23  --   --   --   ALT 21  --   --   --   ALKPHOS 64  --   --   --   BILITOT 0.4  --   --   --   ALBUMIN 3.9  --   --   --   INR 1.0  --   --   --   HGBA1C  --  6.3*  --   --   MG  --   --  2.2  --   CALCIUM 9.3 8.6*  --  8.9      Recent Labs  Lab 11/18/24 2122 11/19/24 0814 11/19/24 1203 11/20/24 0423  INR 1.0  --   --   --   HGBA1C  --  6.3*  --   --   MG  --   --  2.2  --   CALCIUM 9.3 8.6*  --  8.9    --------------------------------------------------------------------------------------------------------------- Lab Results  Component Value Date   CHOL 194 11/19/2024   HDL 54 11/19/2024   LDLCALC 111 (H) 11/19/2024   TRIG 145 11/19/2024   CHOLHDL  3.6 11/19/2024    Lab Results  Component Value Date   HGBA1C 6.3 (H) 11/19/2024   No results for input(s): TSH, T4TOTAL, FREET4, T3FREE, THYROIDAB in the last 72 hours. No results for input(s): VITAMINB12, FOLATE, FERRITIN, TIBC, IRON, RETICCTPCT in the last 72 hours. ------------------------------------------------------------------------------------------------------------------ Cardiac Enzymes No results for input(s): CKMB, TROPONINI, MYOGLOBIN in the last 168 hours.  Invalid input(s): CK  Micro Results No results found for this or any previous visit (from the past 240 hours).  Radiology Report MR CARDIAC MORPHOLOGY W WO CONTRAST Result Date: 11/19/2024 CLINICAL DATA:  Mitral valve mass COMPARISON: Echo 11/19/24 EXAM: MR CARDIA MORPHOLOGY WITHOUT AND WITH CONTRAST; MR CARDIAC VELOCITY FLOW MAPPING TECHNIQUE: The patient was scanned on a 1.5 Tesla Siemens magnet. A dedicated cardiac coil was used. Functional imaging was done using TrueFisp sequences. 2,3, and 4 chamber views were done to assess for RWMA's. Modified Simpson's rule using a short axis stack was used to calculate an ejection fraction on a dedicated work [employer]. The patient received 10mL GADAVIST GADOBUTROL 1 MMOL/ML IV SOLN. After 10 minutes inversion recovery sequences were used to assess for infiltration and scar tissue. Phase contrast velocity encoded images obtained x 2. This examination is tailored for evaluation cardiac anatomy and function and provides very limited assessment of noncardiac structures, which are accordingly not evaluated during interpretation. If there is clinical concern for extracardiac pathology, further evaluation with CT imaging should be considered. FINDINGS: LEFT VENTRICLE: Left ventricular chamber size: Mildly dilated by indexed volumes. Left ventricular wall thickness: Normal. Left ventricular systolic function: Mildly decreased LVEF = 41%, by  biplane volumes 45%. Prominent apical trabeculations without definite evidence of noncompaction cardiomyopathy. Global hypokinesis with subtle mid-apical lateral wall moderate hypokinesis. No myocardial edema, T2 = 48 msec Normal first pass perfusion. There is post contrast delayed myocardial enhancement: Small focus of mid-apical lateral wall LGE, likely subendocardial-midmyocardial, most suggestive of a prior small infarct, possibly embolic. Alternatively, may represent small focus of prior myocarditis. Normal T1 myocardial nulling kinetics suggest against a diagnosis of cardiac amyloidosis. ECV = 30% RIGHT VENTRICLE: Normal right ventricular chamber size. Normal right ventricular wall thickness. Mildly reduced right ventricular systolic function. RVEF = 49% There are no regional wall motion abnormalities. No  post contrast delayed myocardial enhancement. ATRIA: Normal biatrial size with redundant atrial septum. PERICARDIUM: Normal pericardium.  Trivial pericardial effusion. OTHER: No significant extracardiac findings. MEASUREMENTS: Qp/Qs: 1.01 VALVES: Aortic valve regurgitation: Mild, regurgitant fraction 6% Pulmonary valve regurgitation: Trivial, regurgitant fraction 1% Mitral valve regurgitation: Moderate, regurgitant fraction 30% Tricuspid valve regurgitation: Mild-moderate, regurgitant fraction 20% Left ventricle: LV female LV EF: 41 % (Normal 52-79%) Absolute volumes: LV EDV: (Normal 78-167 mL) LV ESV: (Normal 21-64 mL) LV SV: 70mL (Normal 52-114 mL) CO: 5.2L/min (Normal 2.7-6.3 L/min) Indexed volumes: CI: 3.0L/min/sq-m (Normal 1.9-3.9 L/min/sq-m) LV EDV: 71mL/sq-m (Normal 50-96 mL/sq-m) LV ESV: 16mL/sq-m (Normal 10-40 mL/sq-m) LV SV: 83mL/sq-m (Normal 33-64 mL/sq-m) Right ventricle: RV female RV EF: 49% (normal 52-80%) Absolute volumes: RV EDV: (Normal 79-175 mL) RV ESV: 62mL (Normal 13-75 mL) RV SV: 59mL (Normal 56-110 mL) CO: 4.4L/min (Normal 2.7-6 L/min) Indexed volumes: CI:  2.5L/min/sq-m (Normal 1.8-3.8 L/min/sq-m) RV EDV: 73mL/sq-m (Normal 51-97 mL/sq-m) RV ESV: 29mL/sq-m (Normal 9-42 mL/sq-m) RV SV: 43mL/sq-m (Normal 35-61 mL/sq-m) IMPRESSION: 1. 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 Unable to characterize. Consider TEE. 2. Mildly reduced LV systolic function with mild LV dilation by indexed volume. LVEF 41%, visually 45%, biplane volume calculated 45%. There is a small focus of mid-apical inferolateral wall delayed enhancement, likely subendocardial-midmyocardial, most suggestive of a prior small infarct, possibly embolic. Alternatively, may represent small focus of prior myocarditis. There is an associated mid-apical lateral wall regional wall motion abnormality. 3. Normal right ventricular chamber size with mildly reduced RV systolic function, RVEF 49%. 4. Moderate mitral valve regurgitation, mild-moderate tricuspid valve regurgitation. Electronically Signed   By: Soyla Merck M.D.   On: 11/19/2024 22:35   MR CARDIAC VELOCITY FLOW MAP Result Date: 11/19/2024 CLINICAL DATA:  Mitral valve mass COMPARISON: Echo 11/19/24 EXAM: MR CARDIA MORPHOLOGY WITHOUT AND WITH CONTRAST; MR CARDIAC VELOCITY FLOW MAPPING TECHNIQUE: The patient was scanned on a 1.5 Tesla Siemens magnet. A dedicated cardiac coil was used. Functional imaging was done using TrueFisp sequences. 2,3, and 4 chamber views were done to assess for RWMA's. Modified Simpson's rule using a short axis stack was used to calculate an ejection fraction on a dedicated work [employer]. The patient received 10mL GADAVIST GADOBUTROL 1 MMOL/ML IV SOLN. After 10 minutes inversion recovery sequences were used to assess for infiltration and scar tissue. Phase contrast velocity encoded images obtained x 2. This examination is tailored for evaluation cardiac anatomy and function and provides very limited assessment of noncardiac  structures, which are accordingly not evaluated during interpretation. If there is clinical concern for extracardiac pathology, further evaluation with CT imaging should be considered. FINDINGS: LEFT VENTRICLE: Left ventricular chamber size: Mildly dilated by indexed volumes. Left ventricular wall thickness: Normal. Left ventricular systolic function: Mildly decreased LVEF = 41%, by biplane volumes 45%. Prominent apical trabeculations without definite evidence of noncompaction cardiomyopathy. Global hypokinesis with subtle mid-apical lateral wall moderate hypokinesis. No myocardial edema, T2 = 48 msec Normal first pass perfusion. There is post contrast delayed myocardial enhancement: Small focus of mid-apical lateral wall LGE, likely subendocardial-midmyocardial, most suggestive of a prior small infarct, possibly embolic. Alternatively, may represent small focus of prior myocarditis. Normal T1 myocardial nulling kinetics suggest against a diagnosis of cardiac amyloidosis. ECV = 30% RIGHT VENTRICLE: Normal right ventricular chamber size. Normal right ventricular wall thickness. Mildly reduced right ventricular systolic function. RVEF = 49% There  are no regional wall motion abnormalities. No post contrast delayed myocardial enhancement. ATRIA: Normal biatrial size with redundant atrial septum. PERICARDIUM: Normal pericardium.  Trivial pericardial effusion. OTHER: No significant extracardiac findings. MEASUREMENTS: Qp/Qs: 1.01 VALVES: Aortic valve regurgitation: Mild, regurgitant fraction 6% Pulmonary valve regurgitation: Trivial, regurgitant fraction 1% Mitral valve regurgitation: Moderate, regurgitant fraction 30% Tricuspid valve regurgitation: Mild-moderate, regurgitant fraction 20% Left ventricle: LV female LV EF: 41 % (Normal 52-79%) Absolute volumes: LV EDV: (Normal 78-167 mL) LV ESV: (Normal 21-64 mL) LV SV: 70mL (Normal 52-114 mL) CO: 5.2L/min (Normal 2.7-6.3 L/min) Indexed volumes: CI:  3.0L/min/sq-m (Normal 1.9-3.9 L/min/sq-m) LV EDV: 32mL/sq-m (Normal 50-96 mL/sq-m) LV ESV: 58mL/sq-m (Normal 10-40 mL/sq-m) LV SV: 21mL/sq-m (Normal 33-64 mL/sq-m) Right ventricle: RV female RV EF: 49% (normal 52-80%) Absolute volumes: RV EDV: (Normal 79-175 mL) RV ESV: 62mL (Normal 13-75 mL) RV SV: 59mL (Normal 56-110 mL) CO: 4.4L/min (Normal 2.7-6 L/min) Indexed volumes: CI: 2.5L/min/sq-m (Normal 1.8-3.8 L/min/sq-m) RV EDV: 50mL/sq-m (Normal 51-97 mL/sq-m) RV ESV: 60mL/sq-m (Normal 9-42 mL/sq-m) RV SV: 77mL/sq-m (Normal 35-61 mL/sq-m) IMPRESSION: 1. 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 Unable to characterize. Consider TEE. 2. Mildly reduced LV systolic function with mild LV dilation by indexed volume. LVEF 41%, visually 45%, biplane volume calculated 45%. There is a small focus of mid-apical inferolateral wall delayed enhancement, likely subendocardial-midmyocardial, most suggestive of a prior small infarct, possibly embolic. Alternatively, may represent small focus of prior myocarditis. There is an associated mid-apical lateral wall regional wall motion abnormality. 3. Normal right ventricular chamber size with mildly reduced RV systolic function, RVEF 49%. 4. Moderate mitral valve regurgitation, mild-moderate tricuspid valve regurgitation. Electronically Signed   By: Soyla Merck M.D.   On: 11/19/2024 22:35   MR CARDIAC VELOCITY FLOW MAP Result Date: 11/19/2024 CLINICAL DATA:  Mitral valve mass COMPARISON: Echo 11/19/24 EXAM: MR CARDIA MORPHOLOGY WITHOUT AND WITH CONTRAST; MR CARDIAC VELOCITY FLOW MAPPING TECHNIQUE: The patient was scanned on a 1.5 Tesla Siemens magnet. A dedicated cardiac coil was used. Functional imaging was done using TrueFisp sequences. 2,3, and 4 chamber views were done to assess for RWMA's. Modified Simpson's rule using a short axis stack was used to calculate an ejection fraction on a  dedicated work [employer]. The patient received 10mL GADAVIST GADOBUTROL 1 MMOL/ML IV SOLN. After 10 minutes inversion recovery sequences were used to assess for infiltration and scar tissue. Phase contrast velocity encoded images obtained x 2. This examination is tailored for evaluation cardiac anatomy and function and provides very limited assessment of noncardiac structures, which are accordingly not evaluated during interpretation. If there is clinical concern for extracardiac pathology, further evaluation with CT imaging should be considered. FINDINGS: LEFT VENTRICLE: Left ventricular chamber size: Mildly dilated by indexed volumes. Left ventricular wall thickness: Normal. Left ventricular systolic function: Mildly decreased LVEF = 41%, by biplane volumes 45%. Prominent apical trabeculations without definite evidence of noncompaction cardiomyopathy. Global hypokinesis with subtle mid-apical lateral wall moderate hypokinesis. No myocardial edema, T2 = 48 msec Normal first pass perfusion. There is post contrast delayed myocardial enhancement: Small focus of mid-apical lateral wall LGE, likely subendocardial-midmyocardial, most suggestive of a prior small infarct, possibly embolic. Alternatively, may represent small focus of prior myocarditis. Normal T1 myocardial nulling kinetics suggest against a diagnosis of cardiac amyloidosis. ECV = 30% RIGHT VENTRICLE: Normal right ventricular chamber size. Normal right ventricular wall thickness. Mildly reduced right  ventricular systolic function. RVEF = 49% There are no regional wall motion abnormalities. No post contrast delayed myocardial enhancement. ATRIA: Normal biatrial size with redundant atrial septum. PERICARDIUM: Normal pericardium.  Trivial pericardial effusion. OTHER: No significant extracardiac findings. MEASUREMENTS: Qp/Qs: 1.01 VALVES: Aortic valve regurgitation: Mild, regurgitant fraction 6% Pulmonary valve regurgitation: Trivial,  regurgitant fraction 1% Mitral valve regurgitation: Moderate, regurgitant fraction 30% Tricuspid valve regurgitation: Mild-moderate, regurgitant fraction 20% Left ventricle: LV female LV EF: 41 % (Normal 52-79%) Absolute volumes: LV EDV: (Normal 78-167 mL) LV ESV: (Normal 21-64 mL) LV SV: 70mL (Normal 52-114 mL) CO: 5.2L/min (Normal 2.7-6.3 L/min) Indexed volumes: CI: 3.0L/min/sq-m (Normal 1.9-3.9 L/min/sq-m) LV EDV: 45mL/sq-m (Normal 50-96 mL/sq-m) LV ESV: 81mL/sq-m (Normal 10-40 mL/sq-m) LV SV: 7mL/sq-m (Normal 33-64 mL/sq-m) Right ventricle: RV female RV EF: 49% (normal 52-80%) Absolute volumes: RV EDV: (Normal 79-175 mL) RV ESV: 62mL (Normal 13-75 mL) RV SV: 59mL (Normal 56-110 mL) CO: 4.4L/min (Normal 2.7-6 L/min) Indexed volumes: CI: 2.5L/min/sq-m (Normal 1.8-3.8 L/min/sq-m) RV EDV: 37mL/sq-m (Normal 51-97 mL/sq-m) RV ESV: 50mL/sq-m (Normal 9-42 mL/sq-m) RV SV: 28mL/sq-m (Normal 35-61 mL/sq-m) IMPRESSION: 1. 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 Unable to characterize. Consider TEE. 2. Mildly reduced LV systolic function with mild LV dilation by indexed volume. LVEF 41%, visually 45%, biplane volume calculated 45%. There is a small focus of mid-apical inferolateral wall delayed enhancement, likely subendocardial-midmyocardial, most suggestive of a prior small infarct, possibly embolic. Alternatively, may represent small focus of prior myocarditis. There is an associated mid-apical lateral wall regional wall motion abnormality. 3. Normal right ventricular chamber size with mildly reduced RV systolic function, RVEF 49%. 4. Moderate mitral valve regurgitation, mild-moderate tricuspid valve regurgitation. Electronically Signed   By: Soyla Merck M.D.   On: 11/19/2024 22:35   ECHOCARDIOGRAM COMPLETE Result Date: 11/19/2024    ECHOCARDIOGRAM REPORT   Patient Name:   TERILYN SANO Date of Exam:  11/19/2024 Medical Rec #:  [id number]      Height:       62.0 in Accession #:    [id number]     Weight:       149.4 lb Date of Birth:  08/20/1956      BSA:          1.689 m Patient Age:    68 years       BP:           135/72 mmHg Patient Gender: F              HR:           70 bpm. Exam Location:  Inpatient Procedure: 2D Echo (Both Spectral and Color Flow Doppler were utilized during            procedure). Indications:     Stroke  History:         Patient has no prior history of Echocardiogram examinations.                  Stroke.  Sonographer:     Norleen Amour Referring Phys:  [id number] KATY L FOUST Diagnosing Phys: Kardie Tobb DO IMPRESSIONS  1. Left ventricular ejection fraction, by estimation, is 45 to 50%. The left ventricle has mildly decreased function. The left ventricle has no regional wall motion abnormalities. Left ventricular diastolic parameters are consistent with Grade I diastolic dysfunction (impaired relaxation).  2. Right ventricular systolic function is  normal. The right ventricular size is normal. Tricuspid regurgitation signal is inadequate for assessing PA pressure.  3. Mobile mass on the chordae suspicious for thrombus vs calcification. May benefit from Cardiac MR in the settting of recent CVA. SABRA The mitral valve is normal in structure. Mild mitral valve regurgitation. No evidence of mitral stenosis.  4. The aortic valve is normal in structure. Aortic valve regurgitation is not visualized. No aortic stenosis is present.  5. The inferior vena cava is normal in size with greater than 50% respiratory variability, suggesting right atrial pressure of 3 mmHg. FINDINGS  Left Ventricle: Left ventricular ejection fraction, by estimation, is 45 to 50%. The left ventricle has mildly decreased function. The left ventricle has no regional wall motion abnormalities. The left ventricular internal cavity size was normal in size. There is no left ventricular hypertrophy. Left ventricular diastolic parameters are  consistent with Grade I diastolic dysfunction (impaired relaxation). Right Ventricle: The right ventricular size is normal. No increase in right ventricular wall thickness. Right ventricular systolic function is normal. Tricuspid regurgitation signal is inadequate for assessing PA pressure. Left Atrium: Left atrial size was normal in size. Right Atrium: Right atrial size was normal in size. Pericardium: There is no evidence of pericardial effusion. Presence of epicardial fat layer. Mitral Valve: Mobile mass on the chordae suspicious for thrombus vs calcification. May benefit from Cardiac MR in the settting of recent CVA. The mitral valve is normal in structure. There is moderate thickening of the mitral valve leaflet(s). Mild mitral valve regurgitation. No evidence of mitral valve stenosis. MV peak gradient, 3.5 mmHg. The mean mitral valve gradient is 2.0 mmHg. Tricuspid Valve: The tricuspid valve is normal in structure. Tricuspid valve regurgitation is not demonstrated. No evidence of tricuspid stenosis. Aortic Valve: The aortic valve is normal in structure. Aortic valve regurgitation is not visualized. No aortic stenosis is present. Aortic valve mean gradient measures 4.0 mmHg. Aortic valve peak gradient measures 7.5 mmHg. Aortic valve area, by VTI measures 2.51 cm. Pulmonic Valve: The pulmonic valve was normal in structure. Pulmonic valve regurgitation is not visualized. No evidence of pulmonic stenosis. Aorta: The aortic root is normal in size and structure. Venous: The inferior vena cava is normal in size with greater than 50% respiratory variability, suggesting right atrial pressure of 3 mmHg. IAS/Shunts: No atrial level shunt detected by color flow Doppler.  LEFT VENTRICLE PLAX 2D LVIDd:         5.00 cm     Diastology LVIDs:         3.10 cm     LV e' medial:    5.61 cm/s LV PW:         0.90 cm     LV E/e' medial:  18.0 LV IVS:        1.00 cm     LV e' lateral:   10.30 cm/s LVOT diam:     2.20 cm     LV E/e'  lateral: 9.8 LV SV:         68 LV SV Index:   40 LVOT Area:     3.80 cm LV IVRT:       106 msec  LV Volumes (MOD) LV vol d, MOD A2C: 56.1 ml LV vol d, MOD A4C: 83.3 ml LV vol s, MOD A2C: 23.6 ml LV vol s, MOD A4C: 47.3 ml LV SV MOD A2C:     32.5 ml LV SV MOD A4C:     83.3 ml LV SV MOD  BP:      35.2 ml RIGHT VENTRICLE RV Basal diam:  3.40 cm     PULMONARY VEINS RV S prime:     13.40 cm/s  Diastolic Velocity: 52.30 cm/s TAPSE (M-mode): 1.9 cm      S/D Velocity:       0.80                             Systolic Velocity:  43.20 cm/s LEFT ATRIUM             Index        RIGHT ATRIUM           Index LA diam:        3.70 cm 2.19 cm/m   RA Area:     13.20 cm LA Vol (A2C):   52.4 ml 31.03 ml/m  RA Volume:   32.90 ml  19.48 ml/m LA Vol (A4C):   40.2 ml 23.80 ml/m LA Biplane Vol: 46.4 ml 27.48 ml/m  AORTIC VALVE                    PULMONIC VALVE AV Area (Vmax):    2.13 cm     PV Vmax:       1.13 m/s AV Area (Vmean):   2.24 cm     PV Peak grad:  5.1 mmHg AV Area (VTI):     2.51 cm AV Vmax:           137.00 cm/s AV Vmean:          98.600 cm/s AV VTI:            0.270 m AV Peak Grad:      7.5 mmHg AV Mean Grad:      4.0 mmHg LVOT Vmax:         76.60 cm/s LVOT Vmean:        58.000 cm/s LVOT VTI:          0.178 m LVOT/AV VTI ratio: 0.66  AORTA Ao Root diam: 2.50 cm Ao Asc diam:  2.90 cm MITRAL VALVE MV Area (PHT): 3.68 cm     SHUNTS MV Area VTI:   2.38 cm     Systemic VTI:  0.18 m MV Peak grad:  3.5 mmHg     Systemic Diam: 2.20 cm MV Mean grad:  2.0 mmHg MV Vmax:       0.93 m/s MV Vmean:      65.7 cm/s MV Decel Time: 206 msec MV E velocity: 101.00 cm/s MV A velocity: 91.70 cm/s MV E/A ratio:  1.10 Kardie Tobb DO Electronically signed by Dub Huntsman DO Signature Date/Time: 11/19/2024/9:43:41 AM    Final (Updated)    CT ANGIO HEAD NECK W WO CM Result Date: 11/19/2024 EXAM: CTA HEAD AND NECK WITHOUT AND WITH IV CONTRAST 11/19/2024 02:36:38 AM TECHNIQUE: CTA of the head and neck was performed without and with the  administration of 75 mL of iohexol (OMNIPAQUE) 350 mg/mL injection. Multiplanar 2D and/or 3D reformatted images are provided for review. Automated exposure control, iterative reconstruction, and/or weight based adjustment of the mA/kV was utilized to reduce the radiation dose to as low as reasonably achievable. Stenosis of the internal carotid arteries measured using NASCET criteria. COMPARISON: Brain MRI 11/19/2024 CLINICAL HISTORY: Transient ischemic attack (TIA) FINDINGS: CTA NECK: AORTIC ARCH AND ARCH VESSELS: Aortic atherosclerosis. No dissection or arterial injury. No significant stenosis of the brachiocephalic or subclavian  arteries. CERVICAL CAROTID ARTERIES: Tortuous course of the right common carotid artery. Mild atherosclerosis at the right carotid bifurcation and proximal internal carotid artery with less than 50% stenosis. Atherosclerotic calcification at the left carotid bifurcation without hemodynamically significant stenosis. No dissection or arterial injury. 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 Normal right vertebral artery. Mild atherosclerotic calcification of the left vertebral artery V4 segment. No dissection or arterial injury. LUNGS AND MEDIASTINUM: Unremarkable. SOFT TISSUES: No acute abnormality. BONES: No acute abnormality. CTA HEAD: ANTERIOR CIRCULATION: Atherosclerotic calcification of the carotid siphons with mild stenoses of the cavernous segments. 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 No significant stenosis of the anterior cerebral arteries. No significant stenosis of the middle cerebral arteries. No aneurysm. POSTERIOR CIRCULATION: No significant stenosis of the posterior cerebral arteries. No significant stenosis of the basilar artery. No significant stenosis of the vertebral arteries. No aneurysm. OTHER: No dural venous sinus thrombosis on this non-dedicated study. IMPRESSION: 1.  No emergent large vessel occlusion. 2. 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 3. 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 4. Mild atherosclerosis at the carotid bifurcations with less than 50% stenosis. Electronically signed by: Franky Stanford MD 11/19/2024 02:54 AM EST RP Workstation: [id number]   MR BRAIN WO CONTRAST Result Date: 11/19/2024 EXAM: MRI BRAIN WITHOUT CONTRAST 11/19/2024 02:07:30 AM TECHNIQUE: Multiplanar multisequence MRI of the head/brain was performed without the administration of intravenous contrast. COMPARISON: None available. CLINICAL HISTORY: Neuro deficit, acute, stroke suspected. FINDINGS: BRAIN AND VENTRICLES: Multiple small foci of abnormal diffusion restriction are present, including 1 along the interhemispheric fissure at the cingulate gyrus and another punctate lesion within the right midbrain. There is an area of hyperintensity on diffusion-weighted imaging within the left frontal corona radiata with no ADC correlate. 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 No intracranial hemorrhage. No mass. No midline shift. No hydrocephalus. The sella is unremarkable. Normal flow voids. ORBITS: No acute abnormality. SINUSES AND MASTOIDS: No acute abnormality. BONES AND SOFT TISSUES: Normal marrow signal. No acute soft tissue abnormality. IMPRESSION: 1. Multiple small foci of acute/early subacute ischemia, including along the interhemispheric fissure at the cingulate gyrus and a punctate lesion within the right midbrain. 2. 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 Electronically signed by: Franky Stanford MD 11/19/2024 02:21 AM EST RP Workstation: [id number]   CT HEAD WO CONTRAST Result Date: 11/18/2024 CLINICAL DATA:  Intermittent right leg  numbness. EXAM: CT HEAD WITHOUT CONTRAST TECHNIQUE: Contiguous axial images were obtained from the base of the skull through the vertex without intravenous contrast. RADIATION DOSE REDUCTION: This exam was performed according to the departmental dose-optimization program which includes automated exposure control, adjustment of the mA and/or kV according to patient size and/or use of iterative reconstruction technique. COMPARISON:  None Available. FINDINGS: Brain: No evidence of acute infarction, hemorrhage, hydrocephalus, extra-axial collection or mass lesion/mass effect. Vascular: No hyperdense vessel or unexpected calcification. Skull: Normal. Negative for fracture or focal lesion. Sinuses/Orbits: No acute finding. Other: None. IMPRESSION: No acute intracranial pathology. Electronically Signed   By: Suzen Dials M.D.   On: 11/18/2024 22:51     Signature  -   Lavada Stank M.D on 11/20/2024 at 9:38 AM   -  To page go to [email or web address]

## 2024-11-20 NOTE — Transfer of Care (Signed)
 Immediate Anesthesia Transfer of Care Note  Patient: Kaylee Campbell  Procedure(s) Performed: TRANSESOPHAGEAL ECHOCARDIOGRAM  Patient Location: Cath Lab  Anesthesia Type:MAC  Level of Consciousness: sedated, drowsy, and responds to stimulation  Airway & Oxygen Therapy: Patient Spontanous Breathing and Patient connected to face mask oxygen  Post-op Assessment: Report given to RN, Post -op Vital signs reviewed and stable, and Patient moving all extremities X 4  Post vital signs: Reviewed and stable  Last Vitals:  Vitals Value Taken Time  BP 121/61 11/20/24 12:52  Temp 36.6 C 11/20/24 12:52  Pulse 83 11/20/24 12:54  Resp 22 11/20/24 12:54  SpO2 99 % 11/20/24 12:54  Vitals shown include unfiled device data.  Last Pain:  Vitals:   11/20/24 1252  TempSrc: Temporal  PainSc: Asleep         Complications: No notable events documented.

## 2024-11-20 NOTE — Discharge Summary (Signed)
 Discharge summary note.  Kaylee Campbell FMW:[id number] DOB: 1956/01/27 DOA: 11/18/2024  PCP: Gorge Ade, MD  Admit date: 11/18/2024  Discharge date: 11/21/2024  Admitted From: Home   Disposition:  Home   Recommendations for Outpatient Follow-up:   Follow up with PCP in 1-2 weeks  PCP Please obtain BMP/CBC, 2 view CXR in 1week,  (see Discharge instructions)   PCP Please follow up on the following pending results:    Home Health: None   Equipment/Devices: None  Consultations: Neuro, cards Discharge Condition: Stable    CODE STATUS: Full    Diet Recommendation: Heart Healthy     Chief Complaint  Patient presents with   Extremity Weakness     Brief history of present illness from the day of admission and additional interim summary    67 y.o. year old female with past medical history of hypertension, hyperlipidemia, type 2 diabetes mellitus not on outpatient medications, asthma, and tobacco use disorder.  She presented to [facility], ED with bilateral lower extremity numbness and weakness that began yesterday morning when she woke up around 9 AM.  She noted difficulty walking and coordination challenges due to this however it resolved and she went on about her day.  She notes that the symptoms recurred in her (R)LE only around 7 PM and again resolved.  However the recurrence of symptoms prompted her to present to the ED. the ER workup consistent with stroke and admitted to the hospital                                                                  Hospital Course   Acute CVA with MRI positive for -  Multiple small foci of acute/early subacute ischemia, including along the interhemispheric fissure at the cingulate gyrus and a punctate lesion within the right midbrain. Diffusion-weighted hyperintensity  within the left frontal corona radiata without ADC correlate, likely a late subacute infarct.  Neuro team following, full stroke workup being done, currently on DAPT along with statin, for now plan is for DAPT for 3 weeks thereafter Plavix alone per cardiology due to the mobile mass, was found no antiplatelets or statin at home, A1c suggest mild DM type II for which diet control and PCP monitoring will be suggested, LDL was above goal, echocardiogram suggestive of possible mass on mitral valve, cardiac MRI could not confirm it.  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  Finally stable TEE.  DW Neuro Dr Rosemarie DC with 30 day monitor for which cardiology has been consulted as well, all symptoms resolved. Discussed with cardiology and neuro again on 11/21/2024.   Severe stenosis of the left vertebral artery, Severe stenosis of left ICA, Carotid Artery Atherosclerosis  - Outpatient vascular follow-up, DAPT and statin as above.   Hypertension.  Permissive hypertension for #1 above   Dyslipidemia.  Placed on statin.   History of asthma.  Stable.     Ongoing history of smoking.  Counseled, NicoDerm patch.  Pre DM2- PCP to monitor.   Discharge diagnosis     Principal Problem:   Acute ischemic stroke [facility]) Active Problems:   Type II diabetes mellitus (HCC)   Essential hypertension   Hyperlipidemia   Asthma, chronic   Tobacco use disorder   Stenosis of left vertebral artery   Stenosis of left internal carotid artery   Mild atherosclerosis of carotid artery    Discharge instructions    Discharge Instructions     Diet - low sodium heart healthy    Complete by: As directed    Discharge instructions   Complete by: As directed    Follow with Primary MD Gorge Ade, MD in 7 days   Get CBC, CMP, Magnesium, 2 view Chest X ray -  checked next visit with your primary MD   Activity: As tolerated with Full fall precautions use walker/cane & assistance as needed  Disposition Home    Diet: Heart Healthy    Special Instructions: If you have smoked or chewed Tobacco  in the last 2 yrs please stop smoking, stop any regular Alcohol  and or any Recreational drug use.  On your next visit with your primary care physician please Get Medicines reviewed and adjusted.  Please request your Prim.MD to go over all Hospital Tests and Procedure/Radiological results at the follow up, please get all Hospital records sent to your Prim MD by signing hospital release before you go home.  If you experience worsening of your admission symptoms, develop shortness of breath, life threatening emergency, suicidal or homicidal thoughts you must seek medical attention immediately by calling 911 or calling your MD immediately  if symptoms less severe.  You Must read complete instructions/literature along with all the possible adverse reactions/side effects for all the Medicines you take and that have been prescribed to you. Take any new Medicines after you have completely understood and accpet all the possible adverse reactions/side effects.   Do not drive when taking Pain medications.  Do not take more than prescribed Pain, Sleep and Anxiety Medications  Wear Seat belts while driving.   Increase activity slowly   Complete by: As directed        Discharge Medications   Allergies as of 11/21/2024   No Known Allergies      Medication List     TAKE these medications    aspirin EC 81 MG tablet Take 1 tablet (81 mg total) by mouth daily. Swallow whole.   atorvastatin 80 MG tablet Commonly known as: LIPITOR Take 1 tablet (80 mg total) by mouth daily.    clopidogrel 75 MG tablet Commonly known as: PLAVIX Take 1 tablet (75 mg total) by mouth daily.   cyanocobalamin 1000 MCG tablet Take 1,000 mcg by mouth in the morning.   nicotine 14 mg/24hr patch Commonly known as: NICODERM CQ - dosed in mg/24 hours Place 1 patch (14 mg total) onto the skin daily.   triamterene-hydrochlorothiazide 37.5-25 MG tablet Commonly known as:  MAXZIDE-25 Take 1 tablet by mouth in the morning. Start taking on: November 22, 2024         Follow-up Information     Gorge Ade, MD. Schedule an appointment as soon as possible for a visit in 1 week(s).   Specialty: Obstetrics and Gynecology Contact information: [address] [place] [address] [phone]         [facility]. Schedule an appointment as soon as possible for a visit in 1 month(s).   Contact information: [address]     [address] [place]  [address] [phone]        [facility] Follow up.   Specialty: Rehabilitation Why: Please call to schedule an appointment Contact information: [address] [place]  [address] [phone]        Loni Soyla LABOR, MD. Schedule an appointment as soon as possible for a visit in 1 week(s).   Specialties: Cardiology, Radiology Contact information: [address] [place] [address] [phone]         Shyrl Linnie KIDD, MD. Schedule an appointment as soon as possible for a visit in 1 month(s).   Specialty: Cardiothoracic Surgery Contact information: [address] [place] [address] [phone]                 Major procedures and Radiology Reports - PLEASE review detailed and final reports thoroughly  -       ECHO TEE Result Date: 11/20/2024    TRANSESOPHOGEAL ECHO REPORT   Patient Name:   Kaylee Campbell Date of Exam: 11/20/2024 Medical Rec #:  [id number]      Height:       62.0 in  Accession #:    [id number]     Weight:       149.4 lb Date of Birth:  08-Mar-1956      BSA:          1.689 m Patient Age:    68 years       BP:           140/58 mmHg Patient Gender: F              HR:           101 bpm. Exam Location:  Inpatient Procedure: Transesophageal Echo, 3D Echo, Cardiac Doppler, Color Doppler and            Saline Contrast Bubble Study (Both Spectral and Color Flow Doppler            were utilized during procedure). Indications:     Endocarditis  History:         Patient has prior history of Echocardiogram examinations, most                  recent 11/19/2024. Stroke, Signs/Symptoms:Endocarditis; Risk                  Factors:Diabetes, Dyslipidemia, Hypertension and Current                  Smoker.  Sonographer:     Koleen Popper RDCS Referring Phys:  [id number] LEONTINE SAILOR LOCKWOOD Diagnosing Phys: Kardie Tobb DO PROCEDURE: After discussion of the risks and benefits of a TEE, an informed consent was obtained from the patient. The transesophogeal probe was passed without difficulty through the esophogus of the patient. Imaged were obtained with the patient in a left lateral decubitus position. Local oropharyngeal anesthetic was provided  with viscous lidocaine. Sedation performed by different physician. The patient was monitored while under deep sedation. Anesthestetic sedation was provided intravenously by Anesthesiology: 360mg  of Propofol, 100mg  of Lidocaine. The patient developed no complications during the procedure.  IMPRESSIONS  1. Left ventricular ejection fraction, by estimation, is 45 to 50%. The left ventricle has mildly decreased function.  2. Right ventricular systolic function is normal. The right ventricular size is normal.  3. No left atrial/left atrial appendage thrombus was detected.  4. 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 This does not appear to be a thrombus. 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 The mitral valve is abnormal. Moderate mitral valve regurgitation. No evidence of mitral stenosis.  5. The aortic valve is normal in structure. Aortic valve regurgitation is not visualized. No aortic stenosis is present.  6. Agitated saline contrast bubble study was positive with shunting observed after >6 cardiac cycles suggestive of intrapulmonary shunting.  7. 3D performed of the mitral valve. FINDINGS  Left Ventricle: Left ventricular ejection fraction, by estimation, is 45 to 50%. The left ventricle has mildly decreased function. The left ventricular internal cavity size was normal in size. Right Ventricle: The right ventricular size is normal. No increase in right ventricular wall thickness. Right ventricular systolic function is normal. Left Atrium: Left atrial size was normal in size. No left atrial/left atrial appendage thrombus was detected. Right Atrium: Right atrial size was normal in size. Pericardium: There is no evidence of pericardial effusion. Mitral Valve: There is a mobile mass (0.669 cm x 0.576 cm) with irregular borders on the anterior mitral leaflet. This does not appear to be a thrombus. 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 The mitral valve is abnormal. Moderate mitral valve regurgitation, with posteriorly-directed jet. No evidence of mitral valve stenosis. Tricuspid Valve: The tricuspid valve is normal in structure. Tricuspid valve regurgitation is trivial. No evidence of tricuspid stenosis. Aortic Valve: The aortic valve is normal in structure. Aortic valve regurgitation is not visualized. No aortic stenosis is present. Pulmonic Valve: The pulmonic valve was not well visualized. Pulmonic valve regurgitation is trivial. Aorta: The aortic root and ascending aorta are structurally normal, with no evidence of dilitation. IAS/Shunts: Agitated saline  contrast was given intravenously to evaluate for intracardiac shunting. Agitated saline contrast bubble study was positive with shunting observed after >6 cardiac cycles suggestive of intrapulmonary shunting. Additional Comments: Spectral Doppler performed.  AORTA Ao Root diam: 2.90 cm Dub Tobb DO Electronically signed by Dub Huntsman DO Signature Date/Time: 11/20/2024/3:49:11 PM    Final    EP STUDY Result Date: 11/20/2024 See surgical note for result.  MR CARDIAC MORPHOLOGY W WO CONTRAST Result Date: 11/19/2024 CLINICAL DATA:  Mitral valve mass COMPARISON: Echo 11/19/24 EXAM: MR CARDIA MORPHOLOGY WITHOUT AND WITH CONTRAST; MR CARDIAC VELOCITY FLOW MAPPING TECHNIQUE: The patient was scanned on a 1.5 Tesla Siemens magnet. A dedicated cardiac coil was used. Functional imaging was done using TrueFisp sequences. 2,3, and 4 chamber views were done to assess for RWMA's. Modified Simpson's rule using a short axis stack was used to calculate an ejection fraction on a dedicated work [employer]. The patient received 10mL GADAVIST GADOBUTROL 1 MMOL/ML IV SOLN. After 10 minutes inversion recovery sequences were used to assess for infiltration and scar tissue. Phase contrast  velocity encoded images obtained x 2. This examination is tailored for evaluation cardiac anatomy and function and provides very limited assessment of noncardiac structures, which are accordingly not evaluated during interpretation. If there is clinical concern for extracardiac pathology, further evaluation with CT imaging should be considered. FINDINGS: LEFT VENTRICLE: Left ventricular chamber size: Mildly dilated by indexed volumes. Left ventricular wall thickness: Normal. Left ventricular systolic function: Mildly decreased LVEF = 41%, by biplane volumes 45%. Prominent apical trabeculations without definite evidence of noncompaction cardiomyopathy. Global hypokinesis with subtle mid-apical lateral wall moderate hypokinesis. No  myocardial edema, T2 = 48 msec Normal first pass perfusion. There is post contrast delayed myocardial enhancement: Small focus of mid-apical lateral wall LGE, likely subendocardial-midmyocardial, most suggestive of a prior small infarct, possibly embolic. Alternatively, may represent small focus of prior myocarditis. Normal T1 myocardial nulling kinetics suggest against a diagnosis of cardiac amyloidosis. ECV = 30% RIGHT VENTRICLE: Normal right ventricular chamber size. Normal right ventricular wall thickness. Mildly reduced right ventricular systolic function. RVEF = 49% There are no regional wall motion abnormalities. No post contrast delayed myocardial enhancement. ATRIA: Normal biatrial size with redundant atrial septum. PERICARDIUM: Normal pericardium.  Trivial pericardial effusion. OTHER: No significant extracardiac findings. MEASUREMENTS: Qp/Qs: 1.01 VALVES: Aortic valve regurgitation: Mild, regurgitant fraction 6% Pulmonary valve regurgitation: Trivial, regurgitant fraction 1% Mitral valve regurgitation: Moderate, regurgitant fraction 30% Tricuspid valve regurgitation: Mild-moderate, regurgitant fraction 20% Left ventricle: LV female LV EF: 41 % (Normal 52-79%) Absolute volumes: LV EDV: (Normal 78-167 mL) LV ESV: (Normal 21-64 mL) LV SV: 70mL (Normal 52-114 mL) CO: 5.2L/min (Normal 2.7-6.3 L/min) Indexed volumes: CI: 3.0L/min/sq-m (Normal 1.9-3.9 L/min/sq-m) LV EDV: 99mL/sq-m (Normal 50-96 mL/sq-m) LV ESV: 8mL/sq-m (Normal 10-40 mL/sq-m) LV SV: 1mL/sq-m (Normal 33-64 mL/sq-m) Right ventricle: RV female RV EF: 49% (normal 52-80%) Absolute volumes: RV EDV: (Normal 79-175 mL) RV ESV: 62mL (Normal 13-75 mL) RV SV: 59mL (Normal 56-110 mL) CO: 4.4L/min (Normal 2.7-6 L/min) Indexed volumes: CI: 2.5L/min/sq-m (Normal 1.8-3.8 L/min/sq-m) RV EDV: 33mL/sq-m (Normal 51-97 mL/sq-m) RV ESV: 44mL/sq-m (Normal 9-42 mL/sq-m) RV SV: 47mL/sq-m (Normal 35-61 mL/sq-m) IMPRESSION: 1. The mitral valve mass seen  on echocardiography is unable to be well visualized on MRI due to highly mobile mass and small size. Unable to characterize. Consider TEE. 2. Mildly reduced LV systolic function with mild LV dilation by indexed volume. LVEF 41%, visually 45%, biplane volume calculated 45%. There is a small focus of mid-apical inferolateral wall delayed enhancement, likely subendocardial-midmyocardial, most suggestive of a prior small infarct, possibly embolic. Alternatively, may represent small focus of prior myocarditis. There is an associated mid-apical lateral wall regional wall motion abnormality. 3. Normal right ventricular chamber size with mildly reduced RV systolic function, RVEF 49%. 4. Moderate mitral valve regurgitation, mild-moderate tricuspid valve regurgitation. Electronically Signed   By: Soyla Merck M.D.   On: 11/19/2024 22:35   MR CARDIAC VELOCITY FLOW MAP Result Date: 11/19/2024 CLINICAL DATA:  Mitral valve mass COMPARISON: Echo 11/19/24 EXAM: MR CARDIA MORPHOLOGY WITHOUT AND WITH CONTRAST; MR CARDIAC VELOCITY FLOW MAPPING TECHNIQUE: The patient was scanned on a 1.5 Tesla Siemens magnet. A dedicated cardiac coil was used. Functional imaging was done using TrueFisp sequences. 2,3, and 4 chamber views were done to assess for RWMA's. Modified Simpson's rule using a short axis stack was used to calculate an ejection fraction on a dedicated work [employer]. The patient received 10mL GADAVIST GADOBUTROL 1 MMOL/ML IV SOLN. After 10 minutes inversion recovery sequences were used to assess  for infiltration and scar tissue. Phase contrast velocity encoded images obtained x 2. This examination is tailored for evaluation cardiac anatomy and function and provides very limited assessment of noncardiac structures, which are accordingly not evaluated during interpretation. If there is clinical concern for extracardiac pathology, further evaluation with CT imaging should be considered. FINDINGS: LEFT  VENTRICLE: Left ventricular chamber size: Mildly dilated by indexed volumes. Left ventricular wall thickness: Normal. Left ventricular systolic function: Mildly decreased LVEF = 41%, by biplane volumes 45%. Prominent apical trabeculations without definite evidence of noncompaction cardiomyopathy. Global hypokinesis with subtle mid-apical lateral wall moderate hypokinesis. No myocardial edema, T2 = 48 msec Normal first pass perfusion. There is post contrast delayed myocardial enhancement: Small focus of mid-apical lateral wall LGE, likely subendocardial-midmyocardial, most suggestive of a prior small infarct, possibly embolic. Alternatively, may represent small focus of prior myocarditis. Normal T1 myocardial nulling kinetics suggest against a diagnosis of cardiac amyloidosis. ECV = 30% RIGHT VENTRICLE: Normal right ventricular chamber size. Normal right ventricular wall thickness. Mildly reduced right ventricular systolic function. RVEF = 49% There are no regional wall motion abnormalities. No post contrast delayed myocardial enhancement. ATRIA: Normal biatrial size with redundant atrial septum. PERICARDIUM: Normal pericardium.  Trivial pericardial effusion. OTHER: No significant extracardiac findings. MEASUREMENTS: Qp/Qs: 1.01 VALVES: Aortic valve regurgitation: Mild, regurgitant fraction 6% Pulmonary valve regurgitation: Trivial, regurgitant fraction 1% Mitral valve regurgitation: Moderate, regurgitant fraction 30% Tricuspid valve regurgitation: Mild-moderate, regurgitant fraction 20% Left ventricle: LV female LV EF: 41 % (Normal 52-79%) Absolute volumes: LV EDV: (Normal 78-167 mL) LV ESV: (Normal 21-64 mL) LV SV: 70mL (Normal 52-114 mL) CO: 5.2L/min (Normal 2.7-6.3 L/min) Indexed volumes: CI: 3.0L/min/sq-m (Normal 1.9-3.9 L/min/sq-m) LV EDV: 77mL/sq-m (Normal 50-96 mL/sq-m) LV ESV: 73mL/sq-m (Normal 10-40 mL/sq-m) LV SV: 25mL/sq-m (Normal 33-64 mL/sq-m) Right ventricle: RV female RV EF: 49% (normal  52-80%) Absolute volumes: RV EDV: (Normal 79-175 mL) RV ESV: 62mL (Normal 13-75 mL) RV SV: 59mL (Normal 56-110 mL) CO: 4.4L/min (Normal 2.7-6 L/min) Indexed volumes: CI: 2.5L/min/sq-m (Normal 1.8-3.8 L/min/sq-m) RV EDV: 74mL/sq-m (Normal 51-97 mL/sq-m) RV ESV: 39mL/sq-m (Normal 9-42 mL/sq-m) RV SV: 42mL/sq-m (Normal 35-61 mL/sq-m) IMPRESSION: 1. 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 Unable to characterize. Consider TEE. 2. Mildly reduced LV systolic function with mild LV dilation by indexed volume. LVEF 41%, visually 45%, biplane volume calculated 45%. There is a small focus of mid-apical inferolateral wall delayed enhancement, likely subendocardial-midmyocardial, most suggestive of a prior small infarct, possibly embolic. Alternatively, may represent small focus of prior myocarditis. There is an associated mid-apical lateral wall regional wall motion abnormality. 3. Normal right ventricular chamber size with mildly reduced RV systolic function, RVEF 49%. 4. Moderate mitral valve regurgitation, mild-moderate tricuspid valve regurgitation. Electronically Signed   By: Soyla Merck M.D.   On: 11/19/2024 22:35   MR CARDIAC VELOCITY FLOW MAP Result Date: 11/19/2024 CLINICAL DATA:  Mitral valve mass COMPARISON: Echo 11/19/24 EXAM: MR CARDIA MORPHOLOGY WITHOUT AND WITH CONTRAST; MR CARDIAC VELOCITY FLOW MAPPING TECHNIQUE: The patient was scanned on a 1.5 Tesla Siemens magnet. A dedicated cardiac coil was used. Functional imaging was done using TrueFisp sequences. 2,3, and 4 chamber views were done to assess for RWMA's. Modified Simpson's rule using a short axis stack was used to calculate an ejection fraction on a dedicated work [employer]. The patient received 10mL GADAVIST GADOBUTROL 1 MMOL/ML IV SOLN. After 10 minutes  inversion recovery sequences were used to assess for infiltration and scar tissue.  Phase contrast velocity encoded images obtained x 2. This examination is tailored for evaluation cardiac anatomy and function and provides very limited assessment of noncardiac structures, which are accordingly not evaluated during interpretation. If there is clinical concern for extracardiac pathology, further evaluation with CT imaging should be considered. FINDINGS: LEFT VENTRICLE: Left ventricular chamber size: Mildly dilated by indexed volumes. Left ventricular wall thickness: Normal. Left ventricular systolic function: Mildly decreased LVEF = 41%, by biplane volumes 45%. Prominent apical trabeculations without definite evidence of noncompaction cardiomyopathy. Global hypokinesis with subtle mid-apical lateral wall moderate hypokinesis. No myocardial edema, T2 = 48 msec Normal first pass perfusion. There is post contrast delayed myocardial enhancement: Small focus of mid-apical lateral wall LGE, likely subendocardial-midmyocardial, most suggestive of a prior small infarct, possibly embolic. Alternatively, may represent small focus of prior myocarditis. Normal T1 myocardial nulling kinetics suggest against a diagnosis of cardiac amyloidosis. ECV = 30% RIGHT VENTRICLE: Normal right ventricular chamber size. Normal right ventricular wall thickness. Mildly reduced right ventricular systolic function. RVEF = 49% There are no regional wall motion abnormalities. No post contrast delayed myocardial enhancement. ATRIA: Normal biatrial size with redundant atrial septum. PERICARDIUM: Normal pericardium.  Trivial pericardial effusion. OTHER: No significant extracardiac findings. MEASUREMENTS: Qp/Qs: 1.01 VALVES: Aortic valve regurgitation: Mild, regurgitant fraction 6% Pulmonary valve regurgitation: Trivial, regurgitant fraction 1% Mitral valve regurgitation: Moderate, regurgitant fraction 30% Tricuspid valve regurgitation: Mild-moderate, regurgitant fraction 20% Left ventricle: LV female LV EF: 41 % (Normal 52-79%)  Absolute volumes: LV EDV: (Normal 78-167 mL) LV ESV: (Normal 21-64 mL) LV SV: 70mL (Normal 52-114 mL) CO: 5.2L/min (Normal 2.7-6.3 L/min) Indexed volumes: CI: 3.0L/min/sq-m (Normal 1.9-3.9 L/min/sq-m) LV EDV: 40mL/sq-m (Normal 50-96 mL/sq-m) LV ESV: 72mL/sq-m (Normal 10-40 mL/sq-m) LV SV: 62mL/sq-m (Normal 33-64 mL/sq-m) Right ventricle: RV female RV EF: 49% (normal 52-80%) Absolute volumes: RV EDV: (Normal 79-175 mL) RV ESV: 62mL (Normal 13-75 mL) RV SV: 59mL (Normal 56-110 mL) CO: 4.4L/min (Normal 2.7-6 L/min) Indexed volumes: CI: 2.5L/min/sq-m (Normal 1.8-3.8 L/min/sq-m) RV EDV: 20mL/sq-m (Normal 51-97 mL/sq-m) RV ESV: 68mL/sq-m (Normal 9-42 mL/sq-m) RV SV: 70mL/sq-m (Normal 35-61 mL/sq-m) IMPRESSION: 1. 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 Unable to characterize. Consider TEE. 2. Mildly reduced LV systolic function with mild LV dilation by indexed volume. LVEF 41%, visually 45%, biplane volume calculated 45%. There is a small focus of mid-apical inferolateral wall delayed enhancement, likely subendocardial-midmyocardial, most suggestive of a prior small infarct, possibly embolic. Alternatively, may represent small focus of prior myocarditis. There is an associated mid-apical lateral wall regional wall motion abnormality. 3. Normal right ventricular chamber size with mildly reduced RV systolic function, RVEF 49%. 4. Moderate mitral valve regurgitation, mild-moderate tricuspid valve regurgitation. Electronically Signed   By: Soyla Merck M.D.   On: 11/19/2024 22:35   ECHOCARDIOGRAM COMPLETE Result Date: 11/19/2024    ECHOCARDIOGRAM REPORT   Patient Name:   KARRINE KLUTTZ Date of Exam: 11/19/2024 Medical Rec #:  [id number]      Height:       62.0 in Accession #:    [id number]     Weight:       149.4 lb Date of Birth:  05-26-1956      BSA:          1.689 m Patient Age:    6 years  BP:            135/72 mmHg Patient Gender: F              HR:           70 bpm. Exam Location:  Inpatient Procedure: 2D Echo (Both Spectral and Color Flow Doppler were utilized during            procedure). Indications:     Stroke  History:         Patient has no prior history of Echocardiogram examinations.                  Stroke.  Sonographer:     Norleen Amour Referring Phys:  [id number] KATY L FOUST Diagnosing Phys: Kardie Tobb DO IMPRESSIONS  1. Left ventricular ejection fraction, by estimation, is 45 to 50%. The left ventricle has mildly decreased function. The left ventricle has no regional wall motion abnormalities. Left ventricular diastolic parameters are consistent with Grade I diastolic dysfunction (impaired relaxation).  2. Right ventricular systolic function is normal. The right ventricular size is normal. Tricuspid regurgitation signal is inadequate for assessing PA pressure.  3. Mobile mass on the chordae suspicious for thrombus vs calcification. May benefit from Cardiac MR in the settting of recent CVA. SABRA The mitral valve is normal in structure. Mild mitral valve regurgitation. No evidence of mitral stenosis.  4. The aortic valve is normal in structure. Aortic valve regurgitation is not visualized. No aortic stenosis is present.  5. The inferior vena cava is normal in size with greater than 50% respiratory variability, suggesting right atrial pressure of 3 mmHg. FINDINGS  Left Ventricle: Left ventricular ejection fraction, by estimation, is 45 to 50%. The left ventricle has mildly decreased function. The left ventricle has no regional wall motion abnormalities. The left ventricular internal cavity size was normal in size. There is no left ventricular hypertrophy. Left ventricular diastolic parameters are consistent with Grade I diastolic dysfunction (impaired relaxation). Right Ventricle: The right ventricular size is normal. No increase in right ventricular wall thickness. Right ventricular systolic function is  normal. Tricuspid regurgitation signal is inadequate for assessing PA pressure. Left Atrium: Left atrial size was normal in size. Right Atrium: Right atrial size was normal in size. Pericardium: There is no evidence of pericardial effusion. Presence of epicardial fat layer. Mitral Valve: Mobile mass on the chordae suspicious for thrombus vs calcification. May benefit from Cardiac MR in the settting of recent CVA. The mitral valve is normal in structure. There is moderate thickening of the mitral valve leaflet(s). Mild mitral valve regurgitation. No evidence of mitral valve stenosis. MV peak gradient, 3.5 mmHg. The mean mitral valve gradient is 2.0 mmHg. Tricuspid Valve: The tricuspid valve is normal in structure. Tricuspid valve regurgitation is not demonstrated. No evidence of tricuspid stenosis. Aortic Valve: The aortic valve is normal in structure. Aortic valve regurgitation is not visualized. No aortic stenosis is present. Aortic valve mean gradient measures 4.0 mmHg. Aortic valve peak gradient measures 7.5 mmHg. Aortic valve area, by VTI measures 2.51 cm. Pulmonic Valve: The pulmonic valve was normal in structure. Pulmonic valve regurgitation is not visualized. No evidence of pulmonic stenosis. Aorta: The aortic root is normal in size and structure. Venous: The inferior vena cava is normal in size with greater than 50% respiratory variability, suggesting right atrial pressure of 3 mmHg. IAS/Shunts: No atrial level shunt detected by color flow Doppler.  LEFT VENTRICLE PLAX 2D LVIDd:  5.00 cm     Diastology LVIDs:         3.10 cm     LV e' medial:    5.61 cm/s LV PW:         0.90 cm     LV E/e' medial:  18.0 LV IVS:        1.00 cm     LV e' lateral:   10.30 cm/s LVOT diam:     2.20 cm     LV E/e' lateral: 9.8 LV SV:         68 LV SV Index:   40 LVOT Area:     3.80 cm LV IVRT:       106 msec  LV Volumes (MOD) LV vol d, MOD A2C: 56.1 ml LV vol d, MOD A4C: 83.3 ml LV vol s, MOD A2C: 23.6 ml LV vol s, MOD  A4C: 47.3 ml LV SV MOD A2C:     32.5 ml LV SV MOD A4C:     83.3 ml LV SV MOD BP:      35.2 ml RIGHT VENTRICLE RV Basal diam:  3.40 cm     PULMONARY VEINS RV S prime:     13.40 cm/s  Diastolic Velocity: 52.30 cm/s TAPSE (M-mode): 1.9 cm      S/D Velocity:       0.80                             Systolic Velocity:  43.20 cm/s LEFT ATRIUM             Index        RIGHT ATRIUM           Index LA diam:        3.70 cm 2.19 cm/m   RA Area:     13.20 cm LA Vol (A2C):   52.4 ml 31.03 ml/m  RA Volume:   32.90 ml  19.48 ml/m LA Vol (A4C):   40.2 ml 23.80 ml/m LA Biplane Vol: 46.4 ml 27.48 ml/m  AORTIC VALVE                    PULMONIC VALVE AV Area (Vmax):    2.13 cm     PV Vmax:       1.13 m/s AV Area (Vmean):   2.24 cm     PV Peak grad:  5.1 mmHg AV Area (VTI):     2.51 cm AV Vmax:           137.00 cm/s AV Vmean:          98.600 cm/s AV VTI:            0.270 m AV Peak Grad:      7.5 mmHg AV Mean Grad:      4.0 mmHg LVOT Vmax:         76.60 cm/s LVOT Vmean:        58.000 cm/s LVOT VTI:          0.178 m LVOT/AV VTI ratio: 0.66  AORTA Ao Root diam: 2.50 cm Ao Asc diam:  2.90 cm MITRAL VALVE MV Area (PHT): 3.68 cm     SHUNTS MV Area VTI:   2.38 cm     Systemic VTI:  0.18 m MV Peak grad:  3.5 mmHg     Systemic Diam: 2.20 cm MV Mean grad:  2.0 mmHg MV Vmax:  0.93 m/s MV Vmean:      65.7 cm/s MV Decel Time: 206 msec MV E velocity: 101.00 cm/s MV A velocity: 91.70 cm/s MV E/A ratio:  1.10 Kardie Tobb DO Electronically signed by Dub Huntsman DO Signature Date/Time: 11/19/2024/9:43:41 AM    Final (Updated)    CT ANGIO HEAD NECK W WO CM Result Date: 11/19/2024 EXAM: CTA HEAD AND NECK WITHOUT AND WITH IV CONTRAST 11/19/2024 02:36:38 AM TECHNIQUE: CTA of the head and neck was performed without and with the administration of 75 mL of iohexol (OMNIPAQUE) 350 mg/mL injection. Multiplanar 2D and/or 3D reformatted images are provided for review. Automated exposure control, iterative reconstruction, and/or weight based  adjustment of the mA/kV was utilized to reduce the radiation dose to as low as reasonably achievable. Stenosis of the internal carotid arteries measured using NASCET criteria. COMPARISON: Brain MRI 11/19/2024 CLINICAL HISTORY: Transient ischemic attack (TIA) FINDINGS: CTA NECK: AORTIC ARCH AND ARCH VESSELS: Aortic atherosclerosis. No dissection or arterial injury. No significant stenosis of the brachiocephalic or subclavian arteries. CERVICAL CAROTID ARTERIES: Tortuous course of the right common carotid artery. Mild atherosclerosis at the right carotid bifurcation and proximal internal carotid artery with less than 50% stenosis. Atherosclerotic calcification at the left carotid bifurcation without hemodynamically significant stenosis. No dissection or arterial injury. 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 Normal right vertebral artery. Mild atherosclerotic calcification of the left vertebral artery V4 segment. No dissection or arterial injury. LUNGS AND MEDIASTINUM: Unremarkable. SOFT TISSUES: No acute abnormality. BONES: No acute abnormality. CTA HEAD: ANTERIOR CIRCULATION: Atherosclerotic calcification of the carotid siphons with mild stenoses of the cavernous segments. 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 No significant stenosis of the anterior cerebral arteries. No significant stenosis of the middle cerebral arteries. No aneurysm. POSTERIOR CIRCULATION: No significant stenosis of the posterior cerebral arteries. No significant stenosis of the basilar artery. No significant stenosis of the vertebral arteries. No aneurysm. OTHER: No dural venous sinus thrombosis on this non-dedicated study. IMPRESSION: 1. No emergent large vessel occlusion. 2. 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 3. 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 4. Mild atherosclerosis at the carotid bifurcations with less than 50% stenosis. Electronically signed by: Franky Stanford MD 11/19/2024 02:54 AM EST RP Workstation: [id number]   MR BRAIN WO CONTRAST Result Date: 11/19/2024 EXAM: MRI BRAIN WITHOUT CONTRAST 11/19/2024 02:07:30 AM TECHNIQUE: Multiplanar multisequence MRI of the head/brain was performed without the administration of intravenous contrast. COMPARISON: None available. CLINICAL HISTORY: Neuro deficit, acute, stroke suspected. FINDINGS: BRAIN AND VENTRICLES: Multiple small foci of abnormal diffusion restriction are present, including 1 along the interhemispheric fissure at the cingulate gyrus and another punctate lesion within the right midbrain. There is an area of hyperintensity on diffusion-weighted imaging within the left frontal corona radiata with no ADC correlate. 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 No intracranial hemorrhage. No mass. No midline shift. No hydrocephalus. The sella is unremarkable. Normal flow voids. ORBITS: No acute abnormality. SINUSES AND MASTOIDS: No acute abnormality. BONES AND SOFT TISSUES: Normal marrow signal. No acute soft tissue abnormality. IMPRESSION: 1. Multiple small foci of acute/early subacute ischemia, including along the interhemispheric fissure at the cingulate gyrus and a punctate lesion within the right midbrain. 2. 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  Electronically signed by: Franky Stanford MD 11/19/2024 02:21 AM EST RP Workstation: [id number]   CT HEAD WO CONTRAST Result Date: 11/18/2024 CLINICAL DATA:  Intermittent right leg numbness. EXAM: CT HEAD WITHOUT CONTRAST TECHNIQUE: Contiguous axial images were obtained from the base of the skull through the vertex without intravenous contrast. RADIATION DOSE REDUCTION: This exam was performed  according to the departmental dose-optimization program which includes automated exposure control, adjustment of the mA and/or kV according to patient size and/or use of iterative reconstruction technique. COMPARISON:  None Available. FINDINGS: Brain: No evidence of acute infarction, hemorrhage, hydrocephalus, extra-axial collection or mass lesion/mass effect. Vascular: No hyperdense vessel or unexpected calcification. Skull: Normal. Negative for fracture or focal lesion. Sinuses/Orbits: No acute finding. Other: None. IMPRESSION: No acute intracranial pathology. Electronically Signed   By: Suzen Dials M.D.   On: 11/18/2024 22:51    Micro Results    No results found for this or any previous visit (from the past 240 hours).  Today   Subjective    Kaylee Campbell today has no headache,no chest abdominal pain,no new weakness tingling or numbness, feels much better wants to go home today.    Objective   Blood pressure (!) 163/77, pulse 70, temperature 97.8 F (36.6 C), temperature source Oral, resp. rate 14, SpO2 97%.   Intake/Output Summary (Last 24 hours) at 11/21/2024 0908 Last data filed at 11/20/2024 1243 Gross per 24 hour  Intake 200 ml  Output --  Net 200 ml    Exam  Awake Alert, No new F.N deficits,    [place].AT,PERRAL Supple Neck,   Symmetrical Chest wall movement, Good air movement bilaterally, CTAB RRR,No Gallops,   +ve B.Sounds, Abd Soft, Non tender,  No Cyanosis, Clubbing or edema    Data Review   Recent Labs  Lab 11/18/24 2122 11/19/24 0814 11/20/24 0423  WBC 9.8 8.5 8.1  HGB 12.0 11.0* 11.4*  HCT 38.3 34.4* 35.8*  PLT 395 317 349  MCV 82.9 82.1 81.9  MCH 26.0 26.3 26.1  MCHC 31.3 32.0 31.8  RDW 15.9* 16.1* 16.0*    Recent Labs  Lab 11/18/24 2122 11/19/24 0814 11/19/24 1203 11/20/24 0423  NA 142 138  --  142  K 3.5 3.7  --  3.9  CL 102 103  --  101  CO2 30 26  --  30  ANIONGAP 10 9  --  11  GLUCOSE 104* 99  --  100*  BUN 21 22  --  20   CREATININE 1.09* 0.89  --  1.02*  AST 23  --   --   --   ALT 21  --   --   --   ALKPHOS 64  --   --   --   BILITOT 0.4  --   --   --   ALBUMIN 3.9  --   --   --   INR 1.0  --   --   --   HGBA1C  --  6.3*  --   --   MG  --   --  2.2  --   CALCIUM 9.3 8.6*  --  8.9    Total Time in preparing paper work, data evaluation and todays exam - 35 minutes  Signature  -    Lavada Stank M.D on 11/21/2024 at 9:08 AM   -  To page go to [email or web address]

## 2024-11-20 NOTE — Procedures (Incomplete)
 Patient Name: Kaylee Campbell  MRN: [id number]  Epilepsy Attending: Arlin MALVA Krebs  Referring Physician/Provider: Lera Nancyann NOVAK, DO  Date: 11/20/2024 Duration:   Patient history: 68yo F with bilateral lower extremity numbness and weakness. EEG to evaluate for seizure   Level of alertness: Awake, drowsy, sleep, comatose, lethargic ***  AEDs during EEG study: None  Technical aspects: This EEG study was done with scalp electrodes positioned according to the 10-20 International system of electrode placement. Electrical activity was reviewed with band pass filter of 1-70Hz , sensitivity of 7 uV/mm, display speed of 8mm/sec with a 60Hz  notched filter applied as appropriate. EEG data were recorded continuously and digitally stored.  Video monitoring was available and reviewed as appropriate.  Description: The posterior dominant rhythm consists of 9-10 Hz activity of moderate voltage (25-35 uV) seen predominantly in posterior head regions, symmetric and reactive to eye opening and eye closing. Drowsiness was characterized by attenuation of the posterior background rhythm. Sleep was characterized by vertex waves, sleep spindles (12 to 14 Hz), maximal frontocentral region.  There is an excessive amount of 15 to 18 Hz, 2-3 uV beta activity with irregular morphology distributed symmetrically and diffusely.   EEG showed continuous/intermittent generalized polymorphic sharply contoured 3 to 6 Hz theta-delta slowing.  EEG showed generalized periodic discharges with triphasic morphology at  Hz, more prominent when awake/stimulated.  Generalized Spike/Polyspikes/Sharp waves were noted in left/right frontal/temporal/parietal/occipital region.   Seizure was noted arising from left/right frontal/temporal/parietal/occipital region.  During seizure, patient was noted to.  Onset of seizure, total duration  Event button was pressed on at for .  Concomitant EEG before, during and after the event showed normal  posterior dominant rhythm, did not show any EEG changes suggest seizure.  Patient was noted to have episodes of brief sudden eye opening with whole body jerking every few seconds.  Concomitant EEG showed generalized polyspikes consistent with myoclonic seizures.  In between seizures EEG showed generalized background suppression.   Hyperventilation did not show any EEG change.  Physiologic photic driving was not seen during photic stimulation.  Hyperventilation and photic stimulation were not performed.     ABNORMALITY -Sharp wave, generalized, left/right frontal/temporal/parietal/occipital region.  -Spike,generalized, left/right frontal/temporal/parietal/occipital region.  -Polyspikes, generalized, left/right frontal/temporal/parietal/occipital region.  - Lateralized periodic discharges with overriding fast activity ( LPD +) left/right, maximal frontal/temporal/parietal/occipital region - Periodic discharges with triphasic morphology, generalized ( GPDs) - Intermittent slow, generalized - Continuous slow, generalized - Excessive beta, generalized    IMPRESSION: This study is within normal limits.  This study is suggestive of mild/moderate/severe diffuse encephalopathy, nonspecific etiology but likely related to sedation, toxic-metabolic etiology, anoxic/hypoxic brain injury This study is suggestive of cortical dysfunction arising from left/right frontal/temporal/parietal/occipital region, nonspecific etiology, likely secondary to underlying structural abnormality This study showed evidence of potential epileptogenicity arising from This study showed seizures arising from No seizures or epileptiform discharges were seen throughout the recording. However, only wakefulness and drowsiness were recorded. If suspicion for interictal activity remains a concern, a prolonged study including sleep should be considered.  The excessive beta activity seen in the background is most likely due to the  effect of benzodiazepine and is a benign EEG pattern. 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  Additionally there was evidence of severe to profound diffuse encephalopathy.  In the setting of cardiac arrest, this EEG pattern is suggestive of anoxic/hypoxic brain injury.  Dr. was notified.  A normal interictal EEG does not exclude nor support the diagnosis of  epilepsy.   Kaylee Campbell Kaylee Campbell

## 2024-11-20 NOTE — Progress Notes (Signed)
 Ordering 30 day monitor, post stroke. Dr. Sheena to read

## 2024-11-20 NOTE — Care Management Obs Status (Signed)
 MEDICARE OBSERVATION STATUS NOTIFICATION   Patient Details  Name: Kaylee Campbell MRN: [id number] Date of Birth: 06-07-1956   Medicare Observation Status Notification Given:  Yes  Obs notice signed and copy given  Claretta Deed 11/20/2024, 8:55 AM

## 2024-11-20 NOTE — Progress Notes (Signed)
 Physical Therapy Treatment Patient Details Name: Kaylee Campbell MRN: [id number] DOB: 1956/09/07 Today's Date: 11/20/2024   History of Present Illness 68 yo female presents 12/2 with intermittent numbness R LE, NIH 0. MRI showed multiple small foci of acute/early subacute ischemia in R midrbrain and remote subacute infarct left frontal corona radiata. PMH includes: HLD, HTN, and asthma.    PT Comments  Pt agreeable to session, demos good ability to complete unchallenged ambulation and transfers, even without DME. Pt most limited by deficits in safety awareness (no awareness of lines/leads), and deficits in processing and dual tasking. Pt with deficits in dynamic and static stability with narrow BOS and without visual input, discussed implications of these deficits on mobility at home. Will continue to follow and progress higher-level balance and dual task abilities prior to anticipated return home.     If plan is discharge home, recommend the following: Assistance with cooking/housework;Direct supervision/assist for medications management;Direct supervision/assist for financial management;Supervision due to cognitive status;Assist for transportation   Can travel by private vehicle        Equipment Recommendations  None recommended by PT    Recommendations for Other Services       Precautions / Restrictions Precautions Precautions: Fall Recall of Precautions/Restrictions: Impaired Restrictions Weight Bearing Restrictions Per Provider Order: No     Mobility  Bed Mobility Overal bed mobility: Independent             General bed mobility comments: increased time, no assist    Transfers Overall transfer level: Needs assistance Equipment used: None Transfers: Sit to/from Stand Sit to Stand: Supervision           General transfer comment: supervision due to poor awareness of lines, pt able to stand without instability    Ambulation/Gait Ambulation/Gait assistance:  Contact guard assist, Supervision Gait Distance (Feet): 250 Feet Assistive device: None Gait Pattern/deviations: Step-through pattern, Decreased stride length, Shuffle   Gait velocity interpretation: 1.31 - 2.62 ft/sec, indicative of limited community ambulator   General Gait Details: pt with slowed gait, but improved within session. tolerated balance challenge well and progressed to supervision. sliding feet more in her slippers, improved with socks only. slowed movement with consistent 1 error with addition of dual task   Stairs             Wheelchair Mobility     Tilt Bed    Modified Rankin (Stroke Patients Only) Modified Rankin (Stroke Patients Only) Pre-Morbid Rankin Score: No symptoms Modified Rankin: Moderate disability     Balance Overall balance assessment: Needs assistance Sitting-balance support: No upper extremity supported, Feet supported Sitting balance-Leahy Scale: Good Sitting balance - Comments: able to lean outside BOS   Standing balance support: No upper extremity supported Standing balance-Leahy Scale: Fair       Tandem Stance - Right Leg: 0 Tandem Stance - Left Leg: 2 [place] - Eyes Opened: 15 [place] - Eyes Closed: 5 High level balance activites: Backward walking, Direction changes, Turns, Sudden stops, Head turns High Level Balance Comments: minA Standardized Balance Assessment Standardized Balance Assessment : Dynamic Gait Index   Dynamic Gait Index Level Surface: Normal Change in Gait Speed: Normal Gait with Horizontal Head Turns: Mild Impairment Gait with Vertical Head Turns: Mild Impairment Gait and Pivot Turn: Mild Impairment Step Over Obstacle: Mild Impairment Step Around Obstacles: Normal Steps: Mild Impairment Total Score: 19      Communication Communication Communication: No apparent difficulties  Cognition Arousal: Alert Behavior During Therapy: WFL for tasks assessed/performed  PT - Cognitive impairments:  Awareness, Memory, Attention, Safety/Judgement, Sequencing, Problem solving                       PT - Cognition Comments: pt reports no cognitive deficits, is oriented, but with some sequencing and memory issues noted in session. pt unable to count backwards by 3 for more than single number, and made 1 error consistently when attempting to name animals in alphebetical order. not formally assessed Following commands: Intact      Cueing Cueing Techniques: Verbal cues  Exercises      General Comments General comments (skin integrity, edema, etc.): VSS      Pertinent Vitals/Pain Pain Assessment Pain Assessment: No/denies pain     PT Goals (current goals can now be found in the care plan section) Acute Rehab PT Goals Patient Stated Goal: to return home asap PT Goal Formulation: With patient Time For Goal Achievement: 12/03/24 Potential to Achieve Goals: Good Progress towards PT goals: Progressing toward goals    Frequency    Min 2X/week       AM-PAC PT 6 Clicks Mobility   Outcome Measure  Help needed turning from your back to your side while in a flat bed without using bedrails?: None Help needed moving from lying on your back to sitting on the side of a flat bed without using bedrails?: None Help needed moving to and from a bed to a chair (including a wheelchair)?: A Little Help needed standing up from a chair using your arms (e.g., wheelchair or bedside chair)?: A Little Help needed to walk in hospital room?: A Little Help needed climbing 3-5 steps with a railing? : A Little 6 Click Score: 20    End of Session Equipment Utilized During Treatment: Gait belt Activity Tolerance: Patient tolerated treatment well Patient left: in bed;with call bell/phone within reach;with family/visitor present Nurse Communication: Mobility status PT Visit Diagnosis: Unsteadiness on feet (R26.81);Muscle weakness (generalized) (M62.81);Hemiplegia and hemiparesis Hemiplegia -  Right/Left: Right Hemiplegia - dominant/non-dominant: Dominant Hemiplegia - caused by: Cerebral infarction     Time: 0828-0851 PT Time Calculation (min) (ACUTE ONLY): 23 min  Charges:    $Gait Training: 8-22 mins $Neuromuscular Re-education: 8-22 mins PT General Charges $$ ACUTE PT VISIT: 1 Visit                     Izetta Call, PT, DPT   Acute Rehabilitation Department Office [phone] Secure Chat Communication Preferred   Izetta JULIANNA Call 11/20/2024, 10:56 AM

## 2024-11-21 ENCOUNTER — Telehealth: Payer: Self-pay | Admitting: Internal Medicine

## 2024-11-21 ENCOUNTER — Other Ambulatory Visit: Payer: Self-pay | Admitting: Cardiology

## 2024-11-21 ENCOUNTER — Encounter (HOSPITAL_COMMUNITY): Payer: Self-pay | Admitting: Cardiology

## 2024-11-21 ENCOUNTER — Other Ambulatory Visit (HOSPITAL_COMMUNITY): Payer: Self-pay

## 2024-11-21 DIAGNOSIS — I058 Other rheumatic mitral valve diseases: Secondary | ICD-10-CM

## 2024-11-21 LAB — GLUCOSE, CAPILLARY: Glucose-Capillary: 99 mg/dL (ref 70–99)

## 2024-11-21 MED ORDER — CLOPIDOGREL BISULFATE 75 MG PO TABS
75.0000 mg | ORAL_TABLET | Freq: Every day | ORAL | 1 refills | Status: AC
  Filled 2024-11-21: qty 30, 30d supply, fill #0

## 2024-11-21 MED ORDER — ASPIRIN 81 MG PO TBEC
81.0000 mg | DELAYED_RELEASE_TABLET | Freq: Every day | ORAL | 0 refills | Status: DC
  Filled 2024-11-21: qty 20, 20d supply, fill #0

## 2024-11-21 NOTE — Evaluation (Signed)
 Speech Language Pathology Evaluation Patient Details Name: Kaylee Campbell MRN: [id number] DOB: August 03, 1956 Today's Date: 11/21/2024 Time: 9054-8985 SLP Time Calculation (min) (ACUTE ONLY): 29 min  Problem List:  Patient Active Problem List   Diagnosis Date Noted   Acute ischemic stroke (HCC) 11/19/2024   Essential hypertension 11/19/2024   Hyperlipidemia 11/19/2024   Asthma, chronic 11/19/2024   Tobacco use disorder 11/19/2024   Stenosis of left vertebral artery 11/19/2024   Stenosis of left internal carotid artery 11/19/2024   Mild atherosclerosis of carotid artery 11/19/2024   Type II diabetes mellitus (HCC) 03/08/2018   Past Medical History:  Past Medical History:  Diagnosis Date   Asthma    Hyperlipemia    Hypertension    Past Surgical History:  Past Surgical History:  Procedure Laterality Date   ABDOMINAL HYSTERECTOMY     CESAREAN SECTION     UTERINE FIBROID SURGERY     HPI:  68 yo female presents 12/2 with intermittent numbness R LE, NIH 0. MRI showed multiple small foci of acute/early subacute ischemia in R midrbrain and remote subacute infarct left frontal corona radiata. PMH includes: HLD, HTN, and asthma.   Assessment / Plan / Recommendation Clinical Impression  Pt has functional communication with clear speech, but presents with acute changes in cognition (18/30 scored on the SLUMS). Awareness is also reduced, with pt initially expressing that she was completely at her baseline, although family has noticed changes. Pt primarily had difficulty with sustained attention, working memory, retrieval, and problem solving. She does make attempts at self-correcting, suggestive of some online awareness, but she cannot always make corrections accurately. Recommend increased support from family upon initial return home as well as OP SLP follow up.     SLP Assessment  SLP Recommendation/Assessment: All further Speech Language Pathology needs can be addressed in the next venue  of care SLP Visit Diagnosis: Cognitive communication deficit (R41.841)     Assistance Recommended at Discharge  Frequent or constant Supervision/Assistance  Functional Status Assessment Patient has had a recent decline in their functional status and demonstrates the ability to make significant improvements in function in a reasonable and predictable amount of time.  Frequency and Duration           SLP Evaluation Cognition  Overall Cognitive Status: Impaired/Different from baseline Arousal/Alertness: Awake/alert Orientation Level: Oriented to person;Oriented to place;Oriented to situation;Disoriented to time Month: February Attention: Sustained Sustained Attention: Impaired Sustained Attention Impairment: Verbal basic;Verbal complex Memory: Impaired Memory Impairment: Retrieval deficit Awareness: Impaired Awareness Impairment: Intellectual impairment Problem Solving: Impaired Problem Solving Impairment: Verbal complex Safety/Judgment: Impaired       Comprehension  Auditory Comprehension Overall Auditory Comprehension: Impaired Commands: Within Functional Limits Conversation: Simple Other Conversation Comments: impaired comprehension at paragraph level Interfering Components: Attention    Expression Expression Primary Mode of Expression: Verbal Verbal Expression Overall Verbal Expression: Appears within functional limits for tasks assessed   Oral / Motor  Motor Speech Overall Motor Speech: Appears within functional limits for tasks assessed            Leita SAILOR., M.A. CCC-SLP Acute Rehabilitation Services Office: [phone]  Secure chat preferred  11/21/2024, 10:46 AM

## 2024-11-21 NOTE — Progress Notes (Addendum)
 Ordering echocardiogram and coronary CTA to be completed at [facility] office on 12/23/2024. Results to Dr. Acharya

## 2024-11-21 NOTE — Addendum Note (Signed)
 Addended by: NATALIA BIRMINGHAM on: 11/21/2024 10:32 AM   Modules accepted: Orders

## 2024-11-21 NOTE — Telephone Encounter (Signed)
 LVM letting pt know that okay to see APP on 12/24/2024. Pt has Echo and CCTA scheduled for 12/23/2024. We will get in touch with her sooner if Dr. Loni advises any sooner visits.   Hospitalized 11/18/24-11/21/24. TEE performed on 11/20/24 by Dr. Sheena.

## 2024-11-21 NOTE — Telephone Encounter (Signed)
 Pt's daughter called to see if a sooner appt is needed before 12/24/24 for hospital f/u please advise

## 2024-11-21 NOTE — Progress Notes (Addendum)
 Progress Note  Patient Name: Kaylee Campbell Date of Encounter: 11/21/2024 [facility] Cardiologist: None Loni  Interval Summary   Patient feels well today with no chest pain or shortness of breath. 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   Vital Signs Vitals:   11/20/24 2015 11/21/24 0121 11/21/24 0436 11/21/24 0716  BP: 123/75 (!) 148/84 (!) 149/84 (!) 163/77  Pulse: 78 72 70   Resp:      Temp: (!) 97.4 F (36.3 C) 98 F (36.7 C) 97.8 F (36.6 C) 97.8 F (36.6 C)  TempSrc: Oral Oral Oral Oral  SpO2:        Intake/Output Summary (Last 24 hours) at 11/21/2024 0944 Last data filed at 11/20/2024 1243 Gross per 24 hour  Intake 200 ml  Output --  Net 200 ml      06/20/2018    8:18 AM 03/07/2018    8:08 AM  Last 3 Weights  Weight (lbs) 149 lb 6.4 oz 292 lb  Weight (kg) 67.767 kg 132.45 kg      Telemetry/ECG  Not on telemetry- Personally Reviewed  Physical Exam  GEN: No acute distress.   Neck: No JVD Cardiac: RRR, no murmurs, rubs, or gallops.  Respiratory: Clear to auscultation bilaterally. 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  With mitral valve chordal mass strongly suggestive of papillary fibroelastoma.  Cardiac surgery has seen the patient and given recent stroke recommend close follow-up echo to reevaluate the mass given patient's hesitation to proceed with any surgical management in the short-term.  We will order an echocardiogram to be performed in 1 month and I will follow-up with the patient in the office afterward.  If no change in the size  and appearance of the presumed papillary fibroelastoma, we will consider return to [facility] for evaluation for surgical excision. -Patient is currently on Plavix 75 mg daily and aspirin 81 mg daily.  Antiplatelet therapy is the suggested therapy for papillary fibroelastoma.  After 3 weeks of dual antiplatelet therapy which is recommended by the stroke service, would continue Plavix and monotherapy 75 mg daily as management of PFE platelet rich clot potential.  This has been reviewed by neurology and they agree with the plan. -Continues on Lipitor 80 mg daily for indication of stroke.   Heart failure with mildly reduced ejection fraction -By cardiac MRI EF is mildly reduced at about 45%.  There is also evidence of a focal possible infarct, on my review likely embolic but possibly needs ischemic evaluation, this can be completed as an outpatient with coronary CTA.  No chest pain no cardiac awareness.  Plan to obtain a coronary CTA as an outpatient as well.   Moderate mitral valve regurgitation -No definite mitral valve degenerative or primary abnormality seen on TEE, mitral regurgitation calculated as moderate on MRI.  Mechanism of MR not abundantly clear, possible anterior leaflet override in the setting of mild posterior leaflet restriction in the setting of mildly reduced ejection fraction.   [facility] will sign off.   The patient is ready for discharge today from a cardiac standpoint. Medication Recommendations:  as above Other recommendations (labs,  testing, etc):  CCTA, echo as outpatient 12/23/24 Follow up as an outpatient:  1/6 with Callie Goodrich PA For questions or updates, please contact [facility] Please consult [email or web address] for contact info under         Signed, Rhys Anchondo A Averyanna Sax, MD

## 2024-11-22 NOTE — Telephone Encounter (Signed)
 No answer at two different numbers; will attempt week of 11/25/2024

## 2024-11-25 NOTE — Telephone Encounter (Signed)
 Called and spoke to pt. Rec's given per Dr. Loni:   1 month follow up appropriate unless she's having issues. Please reiterate that when she finished the 3 week course of aspirin  and plavix , she is to remain on plavix  until her appointment with Callie who will work with me after I review the upcoming echo. GA   Pt scheduled to see Jadine, [place] on 12/24/24 with Echo and CCTA the day before. Pt verbalized understanding.

## 2024-11-26 ENCOUNTER — Ambulatory Visit

## 2024-12-04 ENCOUNTER — Other Ambulatory Visit: Payer: Self-pay

## 2024-12-04 ENCOUNTER — Encounter: Payer: Self-pay | Admitting: Speech Pathology

## 2024-12-04 ENCOUNTER — Ambulatory Visit: Admitting: Speech Pathology

## 2024-12-04 DIAGNOSIS — G459 Transient cerebral ischemic attack, unspecified: Secondary | ICD-10-CM | POA: Insufficient documentation

## 2024-12-04 DIAGNOSIS — R41841 Cognitive communication deficit: Secondary | ICD-10-CM | POA: Diagnosis present

## 2024-12-04 NOTE — Therapy (Signed)
 OUTPATIENT SPEECH LANGUAGE PATHOLOGY EVALUATION   Patient Name: Kaylee Campbell MRN: [id number] DOB:1956-09-28, 68 y.o., female Today's Date: 12/04/2024  PCP: Gorge Ade, MD REFERRING PROVIDER: Dennise Lavada POUR, MD  END OF SESSION:  End of Session - 12/04/24 1039     Visit Number 1    Number of Visits 1    SLP Start Time 0845    SLP Stop Time  0930    SLP Time Calculation (min) 45 min    Activity Tolerance Patient tolerated treatment well          Past Medical History:  Diagnosis Date   Asthma    Hyperlipemia    Hypertension    Past Surgical History:  Procedure Laterality Date   ABDOMINAL HYSTERECTOMY     CESAREAN SECTION     TRANSESOPHAGEAL ECHOCARDIOGRAM (CATH LAB) N/A 11/20/2024   Procedure: TRANSESOPHAGEAL ECHOCARDIOGRAM;  Surgeon: Sheena Pugh, DO;  Location: [facility] INVASIVE CV LAB;  Service: Cardiovascular;  Laterality: N/A;   UTERINE FIBROID SURGERY     Patient Active Problem List   Diagnosis Date Noted   Acute ischemic stroke (HCC) 11/19/2024   Essential hypertension 11/19/2024   Hyperlipidemia 11/19/2024   Asthma, chronic 11/19/2024   Tobacco use disorder 11/19/2024   Stenosis of left vertebral artery 11/19/2024   Stenosis of left internal carotid artery 11/19/2024   Mild atherosclerosis of carotid artery 11/19/2024   Type II diabetes mellitus (HCC) 03/08/2018    ONSET DATE: 11/19/2024   REFERRING DIAG: G45.9 (ICD-10-CM) - TIA (transient ischemic attack)  THERAPY DIAG:  Cognitive communication deficit  Rationale for Evaluation and Treatment: Rehabilitation  SUBJECTIVE:   SUBJECTIVE STATEMENT: I'm doing ok Pt accompanied by: friend Lonell  PERTINENT HISTORY: 68 yo female presents 12/2 with intermittent numbness R LE, NIH 0. MRI showed multiple small foci of acute/early subacute ischemia in R midrbrain and remote subacute infarct left frontal corona radiata. PMH includes: HLD, HTN, and asthma.  PAIN:  Are you having pain? No  FALLS: Has  patient fallen in last 6 months?  Yes, Number of falls: 1x last week  LIVING ENVIRONMENT: Lives with: lives with their family and lives with their spouse Lives in: House/apartment  PLOF:  Level of assistance: Independent with ADLs, Independent with IADLs Employment: Part-time employment   PATIENT GOALS: To drive  OBJECTIVE:  Note: Objective measures were completed at Evaluation unless otherwise noted.  COGNITION: Overall cognitive status: Impaired Areas of impairment:  Attention: Impaired: Sustained, Selective, Alternating Memory: Impaired: Working Short term Functional deficits: occasional loss of train of thoughts  COGNITIVE COMMUNICATION: Following directions: Follows one step commands inconsistently  Auditory comprehension: WFL Verbal expression: WFL Functional communication: WFL  ORAL MOTOR EXAMINATION: Overall status: WFL  STANDARDIZED ASSESSMENTS: SLUMS: 17/30 points. Score likely not be an accurate representation of pt's true abilities, as pt was conversant throughout with several external distractions.  Assessed by Acute Care SLP on 11/21/24 - pt scored 18/30 points.   PATIENT REPORTED OUTCOME MEASURES (PROM): Cognitive Function: 231. Pt and her friend rated her 5's on all items, except for planning for appointments (daughter has been doing this) and multitasking.  TREATMENT DATE: Eval only   PATIENT EDUCATION: Education details: Compensatory strategies for attention Person educated: Patient and friend Education method: Explanation and Handouts Education comprehension: verbalized understanding  ASSESSMENT:  CLINICAL IMPRESSION: Patient is a 68 y.o. female who was seen today for a cognitive-communication evaluation TIA s/p  on 11/19/24.  Patient presents today with mild high level divided attention deficits. She denies having  difficulty at home. She has returned to completing ADLs/iADLs such as cooking and cleaning with goals to eventually get back to grocery shopping with support from family. She is independently managing her finances, medications, phone use, etc. Spoke with daughter over the phone who denied any safety concerns at this time. Skilled speech therapy services are not indicated at this time. Daughter made aware and verbalized understanding and agreement.    Alayjah Boehringer, Leita Caldron, CCC-SLP 12/04/2024, 10:40 AM

## 2024-12-04 NOTE — Patient Instructions (Addendum)
° ° °  Tips to help facilitate better attention, concentration, focus   Do harder, longer tasks when you are most alert/awake  Break down larger tasks into small parts  Limit distractions of TV, radio, conversation, e mails/texts, appliance noise, etc - if a job is important, do it in a quiet room  Be aware of how you are functioning in high stimulation environments such as large stores, parties, restaurants - any place with lots of lights, noise, signs etc  Group conversations may be more difficult to process than one on one conversations  Give yourself extra time to process conversation, reading materials, directions or information from your healthcare providers  Organization is key - clutters of laundry, mail, paperwork, dirty dishes - all make it more difficult to concentrate  Before you start a task, have all the needed supplies, directions, recipes ready and organized. This way you don't have to go looking for something in the middle of a task and become distracted.   Be aware of fatigue - take rests or breaks when needed to re-group and re-focus  In conversation FAMILY:  Get her attention first Speak face to face Turn off background noises Stop doing other things and focus only on the conversation Kaylee Campbell, Repeat back what you have heard to make sure you got all of the information  Get back to meal planning, shopping and cooking

## 2024-12-06 ENCOUNTER — Other Ambulatory Visit (HOSPITAL_COMMUNITY): Payer: Self-pay

## 2024-12-09 ENCOUNTER — Other Ambulatory Visit (HOSPITAL_COMMUNITY): Payer: Self-pay

## 2024-12-13 NOTE — Progress Notes (Unsigned)
 Appointment originally scheduled for 12/23/2024 to discuss results of coronary CTA and Echo, but these results were not going to be available by time of visit. Therefore, after discussion with patient, appointment was rescheduled for 12/30/2024. We did go ahead and provide refills of Plavix  and Lipitor though.  Kayonna Lawniczak E Brittanni Cariker, PA-C 12/24/2024 7:19 AM

## 2024-12-16 ENCOUNTER — Other Ambulatory Visit (HOSPITAL_COMMUNITY): Payer: Self-pay

## 2024-12-17 ENCOUNTER — Other Ambulatory Visit (HOSPITAL_COMMUNITY): Payer: Self-pay

## 2024-12-18 ENCOUNTER — Telehealth: Payer: Self-pay | Admitting: Student

## 2024-12-18 NOTE — Telephone Encounter (Signed)
 Attempted to call patient with question about appointment next week on 12/23/2024. Left message for patient. Also sent patient a MyChart message. Please see MyChart message if patient returns the call.  Cherell Colvin E Amanat Hackel, PA-C 12/18/2024 11:27 AM

## 2024-12-20 ENCOUNTER — Encounter (HOSPITAL_COMMUNITY): Payer: Self-pay

## 2024-12-20 ENCOUNTER — Other Ambulatory Visit (HOSPITAL_COMMUNITY): Payer: Self-pay

## 2024-12-20 ENCOUNTER — Telehealth (HOSPITAL_COMMUNITY): Payer: Self-pay | Admitting: *Deleted

## 2024-12-20 NOTE — Telephone Encounter (Signed)
Attempted to call patient regarding upcoming cardiac CT appointment. Unable to leave a message.  [name] RN Navigator Cardiac Imaging [facility] [phone] Office [phone] Cell  

## 2024-12-23 ENCOUNTER — Ambulatory Visit (HOSPITAL_BASED_OUTPATIENT_CLINIC_OR_DEPARTMENT_OTHER)
Admission: RE | Admit: 2024-12-23 | Discharge: 2024-12-23 | Disposition: A | Discharge status: Home / Self Care | Source: Ambulatory Visit | Attending: Internal Medicine | Admitting: Internal Medicine

## 2024-12-23 ENCOUNTER — Ambulatory Visit: Admitting: Student

## 2024-12-23 ENCOUNTER — Other Ambulatory Visit: Payer: Self-pay | Admitting: Cardiology

## 2024-12-23 ENCOUNTER — Ambulatory Visit (HOSPITAL_COMMUNITY)
Admission: RE | Admit: 2024-12-23 | Discharge: 2024-12-23 | Disposition: A | Discharge status: Home / Self Care | Source: Ambulatory Visit

## 2024-12-23 ENCOUNTER — Ambulatory Visit: Payer: Self-pay | Admitting: Internal Medicine

## 2024-12-23 ENCOUNTER — Ambulatory Visit (HOSPITAL_BASED_OUTPATIENT_CLINIC_OR_DEPARTMENT_OTHER)
Admission: RE | Admit: 2024-12-23 | Discharge: 2024-12-23 | Disposition: A | Discharge status: Home / Self Care | Source: Ambulatory Visit | Attending: Cardiology | Admitting: Cardiology

## 2024-12-23 DIAGNOSIS — I2510 Atherosclerotic heart disease of native coronary artery without angina pectoris: Secondary | ICD-10-CM | POA: Insufficient documentation

## 2024-12-23 DIAGNOSIS — Z8673 Personal history of transient ischemic attack (TIA), and cerebral infarction without residual deficits: Secondary | ICD-10-CM | POA: Diagnosis not present

## 2024-12-23 DIAGNOSIS — R931 Abnormal findings on diagnostic imaging of heart and coronary circulation: Secondary | ICD-10-CM | POA: Insufficient documentation

## 2024-12-23 DIAGNOSIS — I10 Essential (primary) hypertension: Secondary | ICD-10-CM | POA: Diagnosis not present

## 2024-12-23 DIAGNOSIS — E119 Type 2 diabetes mellitus without complications: Secondary | ICD-10-CM | POA: Diagnosis not present

## 2024-12-23 DIAGNOSIS — I058 Other rheumatic mitral valve diseases: Secondary | ICD-10-CM | POA: Insufficient documentation

## 2024-12-23 DIAGNOSIS — E785 Hyperlipidemia, unspecified: Secondary | ICD-10-CM | POA: Diagnosis not present

## 2024-12-23 DIAGNOSIS — I340 Nonrheumatic mitral (valve) insufficiency: Secondary | ICD-10-CM | POA: Diagnosis present

## 2024-12-23 DIAGNOSIS — F17200 Nicotine dependence, unspecified, uncomplicated: Secondary | ICD-10-CM | POA: Diagnosis not present

## 2024-12-23 LAB — ECHOCARDIOGRAM COMPLETE
Area-P 1/2: 3.85 cm2
MV M vel: 4.86 m/s
MV Peak grad: 94.5 mmHg
Radius: 0.5 cm
S' Lateral: 3.3 cm

## 2024-12-23 MED ORDER — ATORVASTATIN CALCIUM 80 MG PO TABS: 80.0000 mg | ORAL_TABLET | Freq: Every day | ORAL | 1 refills | Status: AC

## 2024-12-23 MED ORDER — CLOPIDOGREL BISULFATE 75 MG PO TABS: 75.0000 mg | ORAL_TABLET | Freq: Every day | ORAL | 1 refills | Status: AC

## 2024-12-23 MED ORDER — IOHEXOL 350 MG/ML SOLN
100.0000 mL | Freq: Once | INTRAVENOUS | Status: AC | PRN
  Administered 2024-12-23: 100 mL via INTRAVENOUS

## 2024-12-23 MED ORDER — NITROGLYCERIN 0.4 MG SL SUBL
0.8000 mg | SUBLINGUAL_TABLET | Freq: Once | SUBLINGUAL | Status: AC
  Administered 2024-12-23: 0.8 mg via SUBLINGUAL

## 2024-12-24 ENCOUNTER — Ambulatory Visit: Admitting: Student

## 2024-12-24 NOTE — Telephone Encounter (Signed)
 Patient returned staff call regarding results.

## 2024-12-30 ENCOUNTER — Ambulatory Visit: Attending: Student | Admitting: Student

## 2024-12-30 ENCOUNTER — Encounter: Payer: Self-pay | Admitting: Student

## 2024-12-30 VITALS — BP 116/74 | HR 78 | Ht 62.0 in | Wt 170.0 lb

## 2024-12-30 DIAGNOSIS — D151 Benign neoplasm of heart: Secondary | ICD-10-CM | POA: Diagnosis not present

## 2024-12-30 DIAGNOSIS — I340 Nonrheumatic mitral (valve) insufficiency: Secondary | ICD-10-CM

## 2024-12-30 DIAGNOSIS — E118 Type 2 diabetes mellitus with unspecified complications: Secondary | ICD-10-CM

## 2024-12-30 DIAGNOSIS — I2510 Atherosclerotic heart disease of native coronary artery without angina pectoris: Secondary | ICD-10-CM

## 2024-12-30 DIAGNOSIS — E785 Hyperlipidemia, unspecified: Secondary | ICD-10-CM

## 2024-12-30 DIAGNOSIS — I10 Essential (primary) hypertension: Secondary | ICD-10-CM

## 2024-12-30 DIAGNOSIS — I5022 Chronic systolic (congestive) heart failure: Secondary | ICD-10-CM

## 2024-12-30 DIAGNOSIS — Z8673 Personal history of transient ischemic attack (TIA), and cerebral infarction without residual deficits: Secondary | ICD-10-CM | POA: Diagnosis not present

## 2024-12-30 MED ORDER — LOSARTAN POTASSIUM 25 MG PO TABS: 25.0000 mg | ORAL_TABLET | Freq: Every day | ORAL | 3 refills | Status: AC

## 2024-12-30 MED ORDER — SPIRONOLACTONE 25 MG PO TABS: 12.5000 mg | ORAL_TABLET | Freq: Every day | ORAL | 3 refills | Status: AC

## 2024-12-30 NOTE — Patient Instructions (Addendum)
 Medication Instructions:  Stop Triamterene -Hydrochlorothiazide  Stop Aspirin   Start Losartan  25mg . Take one tablet daily.  Start spironolactone  12.5mg . Take one tablet daily.  A referral has been placed for you today for Cardiothoracic Surgery.  *If you need a refill on your cardiac medications before your next appointment, please call your pharmacy*   Lab Work: Return for BMET in one week  Further labs will be ordered for you by your provider.......Kaylee Campbell, LIVER FUNCTION TESTING.  If you have labs (blood work) drawn today and your tests are completely normal, you will receive your results only by: MyChart Message (if you have MyChart) OR A paper copy in the mail If you have any lab test that is abnormal or we need to change your treatment, we will call you to review the results.   Testing/Procedures: No procedures were ordered during today's visit.   Your next appointment:   3-4 month(s)   Provider:   [name] Merck, MD     Follow-Up: At [facility], you and your health needs are our priority.  As part of our continuing mission to provide you with exceptional heart care, we have created designated Provider Care Teams.  These Care Teams include your primary Cardiologist (physician) and Advanced Practice Providers (APPs -  Physician Assistants and Nurse Practitioners) who all work together to provide you with the care you need, when you need it. We recommend signing up for the patient portal called MyChart.  Sign up information is provided on this After Visit Summary.  MyChart is used to connect with patients for Virtual Visits (Telemedicine).  Patients are able to view lab/test results, encounter notes, upcoming appointments, etc.  Non-urgent messages can be sent to your provider as well.   To learn more about what you can do with MyChart, go to [email or web address].

## 2024-12-31 ENCOUNTER — Ambulatory Visit

## 2024-12-31 DIAGNOSIS — I639 Cerebral infarction, unspecified: Secondary | ICD-10-CM

## 2025-01-07 DIAGNOSIS — I639 Cerebral infarction, unspecified: Secondary | ICD-10-CM | POA: Diagnosis not present

## 2025-01-16 ENCOUNTER — Ambulatory Visit: Admitting: Internal Medicine

## 2025-01-17 ENCOUNTER — Ambulatory Visit
Attending: Thoracic Surgery (Cardiothoracic Vascular Surgery) | Admitting: Thoracic Surgery (Cardiothoracic Vascular Surgery)

## 2025-01-17 VITALS — BP 151/83 | HR 68 | Resp 18 | Ht 62.0 in | Wt 175.0 lb

## 2025-01-17 DIAGNOSIS — D151 Benign neoplasm of heart: Secondary | ICD-10-CM | POA: Diagnosis not present

## 2025-01-17 LAB — BASIC METABOLIC PANEL WITH GFR
BUN/Creatinine Ratio: 17 (ref 12–28)
BUN: 15 mg/dL (ref 8–27)
CO2: 22 mmol/L (ref 20–29)
Calcium: 9.7 mg/dL (ref 8.7–10.3)
Chloride: 103 mmol/L (ref 96–106)
Creatinine, Ser: 0.86 mg/dL (ref 0.57–1.00)
Glucose: 93 mg/dL (ref 70–99)
Potassium: 4.2 mmol/L (ref 3.5–5.2)
Sodium: 141 mmol/L (ref 134–144)
eGFR: 73 mL/min/{1.73_m2}

## 2025-01-17 NOTE — Progress Notes (Signed)
" ° °   °  [address] [place] [address]             [phone]       Kaylee Campbell [facility] Medical Record #[id number] Date of Birth: 01-01-1956  Referring: Kaylee Aline BRAVO, PA-C Primary Care: Kaylee Emery CROME, MD Primary Cardiologist:Kaylee DELENA Merck, MD  Reason for visit:   follow-up  History of Present Illness:     Kaylee Campbell present for hospital follow-up.  She was previously seen in consultation after having an embolic stroke due to a mitral valve fibroelastoma.  She occasionally has some shortness of breath, but otherwise is asymptomatic.  Physical Exam: BP (!) 151/83   Pulse 68   Resp 18   Ht 5' 2 (1.575 m)   Wt 175 lb (79.4 kg)   SpO2 99%   BMI 32.01 kg/m   Alert NAD Abdomen, ND no peripheral edema   Diagnostic Studies & Laboratory data: TTE:  IMPRESSIONS     1. LVEF and WMA unchanged. Small, mass attached to MV chords with similar  appearance to prior study.   2. Left ventricular ejection fraction, by estimation, is 45 to 50%. The  left ventricle has mildly decreased function. The left ventricle  demonstrates regional wall motion abnormalities (see scoring  diagram/findings for description). Left ventricular  diastolic parameters are consistent with Grade I diastolic dysfunction  (impaired relaxation).   3. Right ventricular systolic function is normal. The right ventricular  size is normal. There is normal pulmonary artery systolic pressure.   4. Small, mobile mass attached to the MV chords that measures 0.5 cm x  1.0 cm. Findings could represent papillary fibroelastoma. Better  characterized on recent TEE. The mitral valve is grossly normal. Mild to  moderate mitral valve regurgitation. No  evidence of mitral stenosis.   5. The aortic valve is tricuspid. Aortic valve regurgitation is not  visualized. No aortic stenosis is present.   6. The inferior vena cava is normal in size with greater than 50%  respiratory  variability, suggesting right atrial pressure of 3 mmHg.     Assessment / Plan:   69 y.o. female with mild/moderate MR, and possible fibroelastoma.  She has had a CVA in the past.  She is ready to move forward with surgery.  She will need dental clearance and a LHC.  I will see her back to discuss details after that.   Kaylee Campbell 01/17/2025 10:51 AM       "

## 2025-01-18 ENCOUNTER — Ambulatory Visit: Payer: Self-pay | Admitting: Student

## 2025-01-21 ENCOUNTER — Telehealth: Payer: Self-pay | Admitting: Student

## 2025-01-21 DIAGNOSIS — D151 Benign neoplasm of heart: Secondary | ICD-10-CM

## 2025-01-21 DIAGNOSIS — I2510 Atherosclerotic heart disease of native coronary artery without angina pectoris: Secondary | ICD-10-CM

## 2025-01-21 NOTE — Telephone Encounter (Signed)
 Patient saw Dr. Shyrl on 01/17/2025 who recommended a left cardiac catheterization prior to valve surgery for possible fibroblastoma.  I called patient and discussed LHC with her today and she has agreed to proceed. She asked that I speak with her daughter Annabella about timing. I spoke with Tiffany. Procedure scheduled for 01/29/2025 at 1pm. She was advised that patient will need to be here at 11am. Will need to recheck CBC and BMET prior to [facility]. Tiffany states they will do this tomorrow.  The patient understands that risks include but are not limited to stroke (1 in 1000), death (1 in 1000), kidney failure [usually temporary] (1 in 500), bleeding (1 in 200), allergic reaction [possibly serious] (1 in 200), and agrees to proceed.   Ailyne Pawley E Hoa Deriso, PA-C 01/21/2025 11:34 AM

## 2025-01-23 ENCOUNTER — Ambulatory Visit: Payer: Self-pay | Admitting: Student

## 2025-01-23 LAB — BASIC METABOLIC PANEL WITH GFR
BUN/Creatinine Ratio: 16 (ref 12–28)
BUN: 13 mg/dL (ref 8–27)
CO2: 21 mmol/L (ref 20–29)
Calcium: 9.9 mg/dL (ref 8.7–10.3)
Chloride: 104 mmol/L (ref 96–106)
Creatinine, Ser: 0.79 mg/dL (ref 0.57–1.00)
Glucose: 86 mg/dL (ref 70–99)
Potassium: 4.2 mmol/L (ref 3.5–5.2)
Sodium: 143 mmol/L (ref 134–144)
eGFR: 81 mL/min/{1.73_m2}

## 2025-01-23 LAB — CBC
Hematocrit: 33.6 % — ABNORMAL LOW (ref 34.0–46.6)
Hemoglobin: 11.1 g/dL (ref 11.1–15.9)
MCH: 26.7 pg (ref 26.6–33.0)
MCHC: 33 g/dL (ref 31.5–35.7)
MCV: 81 fL (ref 79–97)
Platelets: 418 10*3/uL (ref 150–450)
RBC: 4.15 x10E6/uL (ref 3.77–5.28)
RDW: 15.5 % — ABNORMAL HIGH (ref 11.7–15.4)
WBC: 6.5 10*3/uL (ref 3.4–10.8)

## 2025-01-29 ENCOUNTER — Encounter (HOSPITAL_COMMUNITY): Admission: RE | Payer: Self-pay

## 2025-01-29 ENCOUNTER — Ambulatory Visit (HOSPITAL_COMMUNITY): Admission: RE | Admit: 2025-01-29 | Admitting: Cardiology

## 2025-01-31 ENCOUNTER — Ambulatory Visit: Admitting: Neurology

## 2025-02-07 ENCOUNTER — Ambulatory Visit: Admitting: Thoracic Surgery (Cardiothoracic Vascular Surgery)

## 2025-03-31 ENCOUNTER — Ambulatory Visit: Admitting: Internal Medicine
